# Patient Record
Sex: Female | Born: 1985 | Race: White | Hispanic: No | Marital: Married | Smoking: Former smoker
Health system: Southern US, Community
[De-identification: ages and names within clinical notes are randomized; demographics above are authoritative.]

## PROBLEM LIST (undated history)

## (undated) DIAGNOSIS — D649 Anemia, unspecified: Secondary | ICD-10-CM

## (undated) DIAGNOSIS — F419 Anxiety disorder, unspecified: Secondary | ICD-10-CM

## (undated) DIAGNOSIS — C801 Malignant (primary) neoplasm, unspecified: Secondary | ICD-10-CM

---

## 2014-12-29 DIAGNOSIS — B009 Herpesviral infection, unspecified: Secondary | ICD-10-CM | POA: Insufficient documentation

## 2014-12-29 DIAGNOSIS — Z720 Tobacco use: Secondary | ICD-10-CM | POA: Insufficient documentation

## 2015-01-25 DIAGNOSIS — D069 Carcinoma in situ of cervix, unspecified: Secondary | ICD-10-CM | POA: Insufficient documentation

## 2015-05-31 DIAGNOSIS — E663 Overweight: Secondary | ICD-10-CM | POA: Insufficient documentation

## 2015-10-06 DIAGNOSIS — L819 Disorder of pigmentation, unspecified: Secondary | ICD-10-CM | POA: Insufficient documentation

## 2015-10-25 DIAGNOSIS — N730 Acute parametritis and pelvic cellulitis: Secondary | ICD-10-CM | POA: Insufficient documentation

## 2015-11-10 DIAGNOSIS — D229 Melanocytic nevi, unspecified: Secondary | ICD-10-CM | POA: Insufficient documentation

## 2016-01-31 DIAGNOSIS — F17200 Nicotine dependence, unspecified, uncomplicated: Secondary | ICD-10-CM | POA: Insufficient documentation

## 2016-01-31 DIAGNOSIS — R194 Change in bowel habit: Secondary | ICD-10-CM | POA: Insufficient documentation

## 2016-04-10 DIAGNOSIS — J0141 Acute recurrent pansinusitis: Secondary | ICD-10-CM | POA: Diagnosis not present

## 2016-04-10 DIAGNOSIS — J3489 Other specified disorders of nose and nasal sinuses: Secondary | ICD-10-CM | POA: Diagnosis not present

## 2016-04-10 DIAGNOSIS — R0982 Postnasal drip: Secondary | ICD-10-CM | POA: Diagnosis not present

## 2016-04-10 DIAGNOSIS — R05 Cough: Secondary | ICD-10-CM | POA: Diagnosis not present

## 2016-07-12 DIAGNOSIS — D061 Carcinoma in situ of exocervix: Secondary | ICD-10-CM | POA: Diagnosis not present

## 2016-10-28 DIAGNOSIS — S91115A Laceration without foreign body of left lesser toe(s) without damage to nail, initial encounter: Secondary | ICD-10-CM | POA: Diagnosis not present

## 2016-11-14 DIAGNOSIS — F430 Acute stress reaction: Secondary | ICD-10-CM | POA: Diagnosis not present

## 2016-11-14 DIAGNOSIS — R41840 Attention and concentration deficit: Secondary | ICD-10-CM | POA: Diagnosis not present

## 2017-01-03 DIAGNOSIS — Z0000 Encounter for general adult medical examination without abnormal findings: Secondary | ICD-10-CM | POA: Diagnosis not present

## 2017-01-17 DIAGNOSIS — J019 Acute sinusitis, unspecified: Secondary | ICD-10-CM | POA: Diagnosis not present

## 2017-01-17 DIAGNOSIS — D061 Carcinoma in situ of exocervix: Secondary | ICD-10-CM | POA: Diagnosis not present

## 2017-01-17 DIAGNOSIS — F17200 Nicotine dependence, unspecified, uncomplicated: Secondary | ICD-10-CM | POA: Diagnosis not present

## 2017-01-17 DIAGNOSIS — B009 Herpesviral infection, unspecified: Secondary | ICD-10-CM | POA: Diagnosis not present

## 2017-01-17 DIAGNOSIS — Z0000 Encounter for general adult medical examination without abnormal findings: Secondary | ICD-10-CM | POA: Diagnosis not present

## 2017-01-17 DIAGNOSIS — F430 Acute stress reaction: Secondary | ICD-10-CM | POA: Diagnosis not present

## 2017-02-07 MED FILL — ESCITALOPRAM 10 MG TABLET: 10 | 30 days supply | Qty: 15 | Fill #0

## 2017-03-28 MED FILL — ESCITALOPRAM 10 MG TABLET: 10 | 30 days supply | Qty: 15 | Fill #0

## 2017-04-17 DIAGNOSIS — F430 Acute stress reaction: Secondary | ICD-10-CM | POA: Diagnosis not present

## 2017-04-17 DIAGNOSIS — Z720 Tobacco use: Secondary | ICD-10-CM | POA: Diagnosis not present

## 2017-04-17 MED FILL — SERTRALINE HCL 25 MG TABLET: 25 | 30 days supply | Qty: 15 | Fill #0

## 2017-05-12 MED FILL — SERTRALINE HCL 25 MG TABLET: 25 | 90 days supply | Qty: 90 | Fill #0

## 2017-07-07 DIAGNOSIS — R079 Chest pain, unspecified: Secondary | ICD-10-CM | POA: Diagnosis not present

## 2017-07-07 DIAGNOSIS — R072 Precordial pain: Secondary | ICD-10-CM | POA: Diagnosis not present

## 2017-07-08 DIAGNOSIS — R079 Chest pain, unspecified: Secondary | ICD-10-CM | POA: Diagnosis not present

## 2017-08-06 DIAGNOSIS — D72829 Elevated white blood cell count, unspecified: Secondary | ICD-10-CM | POA: Diagnosis not present

## 2017-08-06 DIAGNOSIS — E876 Hypokalemia: Secondary | ICD-10-CM | POA: Diagnosis not present

## 2017-08-06 DIAGNOSIS — F17200 Nicotine dependence, unspecified, uncomplicated: Secondary | ICD-10-CM | POA: Diagnosis not present

## 2017-08-06 DIAGNOSIS — R002 Palpitations: Secondary | ICD-10-CM | POA: Diagnosis not present

## 2017-08-06 DIAGNOSIS — R072 Precordial pain: Secondary | ICD-10-CM | POA: Diagnosis not present

## 2017-08-06 DIAGNOSIS — Z09 Encounter for follow-up examination after completed treatment for conditions other than malignant neoplasm: Secondary | ICD-10-CM | POA: Diagnosis not present

## 2017-08-06 DIAGNOSIS — R454 Irritability and anger: Secondary | ICD-10-CM | POA: Diagnosis not present

## 2017-08-06 DIAGNOSIS — J309 Allergic rhinitis, unspecified: Secondary | ICD-10-CM | POA: Diagnosis not present

## 2017-08-06 DIAGNOSIS — R4586 Emotional lability: Secondary | ICD-10-CM | POA: Diagnosis not present

## 2017-08-29 DIAGNOSIS — H52203 Unspecified astigmatism, bilateral: Secondary | ICD-10-CM | POA: Diagnosis not present

## 2017-09-17 DIAGNOSIS — R102 Pelvic and perineal pain: Secondary | ICD-10-CM | POA: Diagnosis not present

## 2017-09-17 DIAGNOSIS — N730 Acute parametritis and pelvic cellulitis: Secondary | ICD-10-CM | POA: Diagnosis not present

## 2017-09-17 DIAGNOSIS — Z975 Presence of (intrauterine) contraceptive device: Secondary | ICD-10-CM | POA: Diagnosis not present

## 2017-10-03 DIAGNOSIS — R4586 Emotional lability: Secondary | ICD-10-CM | POA: Diagnosis not present

## 2017-10-03 DIAGNOSIS — R454 Irritability and anger: Secondary | ICD-10-CM | POA: Diagnosis not present

## 2017-10-03 DIAGNOSIS — F430 Acute stress reaction: Secondary | ICD-10-CM | POA: Diagnosis not present

## 2017-10-15 MED FILL — busPIRone HCL 10 MG TABS: 10 | 30 days supply | Qty: 45 | Fill #0

## 2017-11-18 MED FILL — busPIRone HCL 10 MG TABS: 10 | 30 days supply | Qty: 45 | Fill #1

## 2017-12-11 ENCOUNTER — Telehealth: Payer: Self-pay | Admitting: Allergy & Immunology

## 2017-12-11 DIAGNOSIS — Z578 Occupational exposure to other risk factors: Secondary | ICD-10-CM | POA: Diagnosis not present

## 2017-12-11 NOTE — Telephone Encounter (Signed)
Chandy was exposed to a patient's blood. Labs placed per protocol.   Salvatore Marvel, MD [facility]

## 2017-12-13 LAB — HCV RNA QUANT: Hepatitis C Quantitation: NOT DETECTED IU/mL

## 2017-12-13 LAB — HEPATITIS C ANTIBODY: Hep C Virus Ab: <0.1 s/co ratio (ref 0.0–0.9)

## 2017-12-13 LAB — HEPATITIS B SURFACE ANTIGEN: HEP B S AG: NEGATIVE

## 2017-12-13 LAB — HEPATITIS B SURFACE ANTIBODY,QUALITATIVE: Hep B Surface Ab, Qual: REACTIVE

## 2017-12-13 LAB — HIV ANTIBODY (ROUTINE TESTING W REFLEX): HIV SCREEN 4TH GENERATION: NONREACTIVE

## 2017-12-13 LAB — HIV-1 RNA QUANT-NO REFLEX-BLD

## 2017-12-13 LAB — HEPATITIS B CORE ANTIBODY, TOTAL: Hep B Core Total Ab: NEGATIVE

## 2017-12-22 MED FILL — busPIRone HCL 10 MG TABS: 10 | 30 days supply | Qty: 45 | Fill #2

## 2018-01-30 DIAGNOSIS — E669 Obesity, unspecified: Secondary | ICD-10-CM | POA: Diagnosis not present

## 2018-01-30 DIAGNOSIS — Z0000 Encounter for general adult medical examination without abnormal findings: Secondary | ICD-10-CM | POA: Diagnosis not present

## 2018-01-30 DIAGNOSIS — Z1151 Encounter for screening for human papillomavirus (HPV): Secondary | ICD-10-CM | POA: Diagnosis not present

## 2018-01-30 DIAGNOSIS — D061 Carcinoma in situ of exocervix: Secondary | ICD-10-CM | POA: Diagnosis not present

## 2018-01-30 DIAGNOSIS — Z01419 Encounter for gynecological examination (general) (routine) without abnormal findings: Secondary | ICD-10-CM | POA: Diagnosis not present

## 2018-01-30 DIAGNOSIS — Z720 Tobacco use: Secondary | ICD-10-CM | POA: Diagnosis not present

## 2018-01-30 DIAGNOSIS — Z124 Encounter for screening for malignant neoplasm of cervix: Secondary | ICD-10-CM | POA: Diagnosis not present

## 2018-01-30 DIAGNOSIS — F430 Acute stress reaction: Secondary | ICD-10-CM | POA: Diagnosis not present

## 2018-01-30 DIAGNOSIS — B009 Herpesviral infection, unspecified: Secondary | ICD-10-CM | POA: Diagnosis not present

## 2018-01-30 DIAGNOSIS — F17210 Nicotine dependence, cigarettes, uncomplicated: Secondary | ICD-10-CM | POA: Diagnosis not present

## 2018-01-30 DIAGNOSIS — Z23 Encounter for immunization: Secondary | ICD-10-CM | POA: Diagnosis not present

## 2018-02-16 DIAGNOSIS — W19XXXA Unspecified fall, initial encounter: Secondary | ICD-10-CM | POA: Diagnosis not present

## 2018-02-16 DIAGNOSIS — M542 Cervicalgia: Secondary | ICD-10-CM | POA: Diagnosis not present

## 2018-02-16 DIAGNOSIS — M62838 Other muscle spasm: Secondary | ICD-10-CM | POA: Diagnosis not present

## 2018-02-16 DIAGNOSIS — S161XXA Strain of muscle, fascia and tendon at neck level, initial encounter: Secondary | ICD-10-CM | POA: Diagnosis not present

## 2018-02-16 DIAGNOSIS — Y999 Unspecified external cause status: Secondary | ICD-10-CM | POA: Diagnosis not present

## 2018-03-04 ENCOUNTER — Encounter (HOSPITAL_COMMUNITY): Payer: Self-pay | Admitting: Emergency Medicine

## 2018-03-04 ENCOUNTER — Ambulatory Visit (HOSPITAL_COMMUNITY)
Admission: EM | Admit: 2018-03-04 | Discharge: 2018-03-04 | Disposition: A | Discharge status: Home / Self Care | Payer: 59 | Attending: Family Medicine | Admitting: Family Medicine

## 2018-03-04 DIAGNOSIS — H538 Other visual disturbances: Secondary | ICD-10-CM

## 2018-03-04 DIAGNOSIS — R61 Generalized hyperhidrosis: Secondary | ICD-10-CM | POA: Diagnosis not present

## 2018-03-04 DIAGNOSIS — Z3202 Encounter for pregnancy test, result negative: Secondary | ICD-10-CM | POA: Diagnosis not present

## 2018-03-04 DIAGNOSIS — Z8659 Personal history of other mental and behavioral disorders: Secondary | ICD-10-CM

## 2018-03-04 DIAGNOSIS — R110 Nausea: Secondary | ICD-10-CM

## 2018-03-04 DIAGNOSIS — R42 Dizziness and giddiness: Secondary | ICD-10-CM | POA: Diagnosis not present

## 2018-03-04 DIAGNOSIS — R55 Syncope and collapse: Secondary | ICD-10-CM

## 2018-03-04 LAB — POCT PREGNANCY, URINE: Preg Test, Ur: NEGATIVE

## 2018-03-04 NOTE — Discharge Instructions (Addendum)
Recommend continue to monitor symptoms. Try to stay cool since overheating could have caused this episode. Continue to push fluids today such as Gatorade or Powerade. Follow-up with your PCP if dizziness or lightheadedness return within the next few days or go to the ER if symptoms worsen.

## 2018-03-04 NOTE — ED Triage Notes (Signed)
Pt states she had a near syncopal episode at work, pt states she felt lightheaded and dizzy, sweaty palms, knees weak, blurry vision. Denies LOC.

## 2018-03-05 NOTE — ED Provider Notes (Signed)
[name]    CSN: [id number] Arrival date & time: 03/04/18  2355     History   Chief Complaint Chief Complaint  Patient presents with  . Near Syncope    HPI Brittney Black is a 32 y.o. female.   32 year old female presents with near syncopal episode about 1 hour ago while at work. 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 She then got lightheaded, sweaty, dizzy and experienced some blurred vision. She helped herself down to the floor and sat for 30 minutes. She denies any LOC, headache, URI symptoms, chest pain, palpitations, shortness of breath, nausea or vomiting. She has had a previous episode before when she got overheated. Today the heat in her workplace was "turned up" and she felt too warm. She just quit smoking 2 weeks ago but still occassionally vapes. She denies any recent alcohol and no illicit drug use. No recent change in diet. She currently has a Mirena IUD in place but requests pregnancy test "just to make sure". She had a full GYN and comprehensive annual exam about 1 month ago- able to view encounter but not able to view labs in [place]. Patient pulled up her labwork on her phone. CBC, Chem profile, TSH were all essentially normal. Had random glucose tested at her workplace today during her episode and was 91. Does have a history of anemia over 5 years ago but no longer on any medication. Other chronic health issues include anxiety and allergies and takes Buspar daily and Allegra, Flonase, Nexium, Valtrex and Ibuprofen prn. Patient states she "feels fine now" but employer will not let her return to work without evaluation.   The history is provided by the patient.    History reviewed. No pertinent past medical history.  Patient Active Problem List   Diagnosis Date Noted  . Frequent bowel movements 01/31/2016  . Tobacco dependency 01/31/2016  . Compound nevus of skin 11/10/2015  . Acute PID (pelvic inflammatory  disease) 10/25/2015  . Changing pigmented skin lesion 10/06/2015  . Overweight (BMI 25.0-29.9) 05/31/2015  . 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 Herpesviral infection, unspecified 12/29/2014  . Tobacco use 12/29/2014    History reviewed. No pertinent surgical history.  OB History   None      Home Medications    Prior to Admission medications   Medication Sig Start Date End Date Taking? Authorizing Provider  busPIRone (BUSPAR) 15 MG tablet Take by mouth. 01/30/18  Yes [provider]  fexofenadine (ALLEGRA) 180 MG tablet  07/03/16  Yes [provider]  Fluticasone Propionate (XHANCE) 93 MCG/ACT [place] into the nose. 08/06/17  Yes [provider]  levonorgestrel (MIRENA) 20 MCG/24HR IUD by Intrauterine route. 03/03/15  Yes [provider]  valACYclovir (VALTREX) 1000 MG tablet Take by mouth. 01/30/18  Yes [provider]  esomeprazole (NEXIUM) 20 MG capsule Take by mouth.    [provider]  ibuprofen (ADVIL,MOTRIN) 600 MG tablet Take by mouth.    [provider]    Family History Family History  Problem Relation Age of Onset  . Healthy Mother   . Healthy Father     Social History Social History   Tobacco Use  . Smoking status: Former Smoker  Substance Use Topics  . Alcohol use: Yes    Frequency: Never  . Drug use: Never     Allergies   Latex; Lorazepam; Naproxen; Naproxen sodium; Acetaminophen; Niacin;  and Niacin and related   Review of Systems Review of Systems  Constitutional: Negative for activity change, appetite change, chills, diaphoresis, fatigue and fever.  HENT: Negative for congestion, ear pain, facial swelling, hearing loss, mouth sores, nosebleeds, postnasal drip, rhinorrhea, sinus pressure, sinus pain, sneezing, sore throat, tinnitus and trouble swallowing.   Eyes: Positive for visual disturbance (during episode- normal now). Negative for photophobia, pain, discharge,  redness and itching.  Respiratory: Negative for cough, chest tightness, shortness of breath, wheezing and stridor.   Cardiovascular: Negative for chest pain, palpitations and leg swelling.  Gastrointestinal: Positive for nausea. Negative for abdominal pain, constipation, diarrhea and vomiting.  Genitourinary: Negative for decreased urine volume, difficulty urinating, frequency, hematuria, urgency, vaginal bleeding and vaginal discharge.  Musculoskeletal: Negative for arthralgias, back pain, myalgias, neck pain and neck stiffness.  Skin: Negative for color change, pallor, rash and wound.  Allergic/Immunologic: Positive for environmental allergies. Negative for immunocompromised state.  Neurological: Positive for dizziness and light-headedness. Negative for tremors, seizures, syncope, facial asymmetry, speech difficulty, weakness, numbness and headaches.  Hematological: Negative for adenopathy. Does not bruise/bleed easily.     Physical Exam Triage Vital Signs ED Triage Vitals  Enc Vitals Group     BP 03/04/18 1049 (!) 141/67     Pulse Rate 03/04/18 1049 73     Resp 03/04/18 1049 16     Temp 03/04/18 1049 98.2 F (36.8 C)     Temp src --      SpO2 03/04/18 1049 100 %     Weight --      Height --      Head Circumference --      Peak Flow --      Pain Score 03/04/18 1050 0     Pain Loc --      Pain Edu? --      Excl. in [place]? --    Orthostatic VS for the past 24 hrs:  BP- Lying Pulse- Lying BP- Sitting Pulse- Sitting BP- Standing at 0 minutes  03/04/18 1108 125/80 79 124/90 82 -    Updated Vital Signs BP (!) 141/67   Pulse 73   Temp 98.2 F (36.8 C)   Resp 16   SpO2 100%   Visual Acuity Right Eye Distance:   Left Eye Distance:   Bilateral Distance:    Right Eye Near:   Left Eye Near:    Bilateral Near:     Physical Exam  Constitutional: She is oriented to person, place, and time. She appears well-developed and well-nourished. She is cooperative. She does not appear  ill. No distress.  Patient sitting comfortably on exam table in no acute distress. Orthostatic vitals were obtained and no distinct changes noted other than difficulty with automatic BP measurement when patient was standing. Obtained manual BP which was normal.   HENT:  Head: Normocephalic and atraumatic.  Right Ear: Hearing, tympanic membrane, external ear and ear canal normal.  Left Ear: Hearing, tympanic membrane, external ear and ear canal normal.  Nose: Nose normal.  Mouth/Throat: Uvula is midline, oropharynx is clear and moist and mucous membranes are normal.  Eyes: Pupils are equal, round, and reactive to light. Conjunctivae and EOM are normal.  Neck: Normal range of motion. Neck supple.  Cardiovascular: Normal rate, regular rhythm, normal heart sounds, intact distal pulses and normal pulses.  No murmur heard. Pulmonary/Chest: Effort normal and breath sounds normal. No respiratory distress. She has no decreased breath sounds. She has no wheezes. She has no rhonchi.  She has no rales.  Musculoskeletal: Normal range of motion.  Lymphadenopathy:    She has no cervical adenopathy.  Neurological: She is alert and oriented to person, place, and time. She has normal strength and normal reflexes. No cranial nerve deficit or sensory deficit. She displays a negative Romberg sign. Gait normal.  Skin: Skin is warm and dry. Capillary refill takes less than 2 seconds. No pallor.  Psychiatric: She has a normal mood and affect. Her behavior is normal. Judgment and thought content normal.  Nursing note and vitals reviewed.    UC Treatments / Results  Labs (all labs ordered are listed, but only abnormal results are displayed) Labs Reviewed  POCT PREGNANCY, URINE    EKG None  Radiology No results found.  Procedures Procedures (including critical care time)  Medications Ordered in UC Medications - No data to display  Initial Impression / Assessment and Plan / UC Course  I have reviewed  the triage vital signs and the nursing notes.  Pertinent labs & imaging results that were available during my care of the patient were reviewed by me and considered in my medical decision making (see chart for details).    Reviewed negative urine pregnancy test with patient. Discussed that since she is feeling much better now and random glucose measurement taken at work today was normal (91) as well as recent Chemistry and CBC labs done in the past month were normal and no abnormalities found on exam, no additional work-up needed at this time. Continue to monitor symptoms. Recommend try to stay cool and do not get overheated. Recommend push water and other fluids such as Gatorade today. Note written to go back to work now with no restrictions. Recommend follow-up with her PCP if dizziness, lightheadedness or near syncope occurs within the next few days or go to the ER if symptoms worsen or any chest pain, difficulty breathing or headache develop.  Final Clinical Impressions(s) / UC Diagnoses   Final diagnoses:  Near syncope  Nausea without vomiting  Hx of anxiety disorder     Discharge Instructions     Recommend continue to monitor symptoms. Try to stay cool since overheating could have caused this episode. Continue to push fluids today such as Gatorade or Powerade. Follow-up with your PCP if dizziness or lightheadedness return within the next few days or go to the ER if symptoms worsen.     ED Prescriptions    None     Controlled Substance Prescriptions  Controlled Substance Registry consulted? Not Applicable   Katy Apo, NP 03/05/18 1019

## 2018-03-18 MED FILL — busPIRone HCL 15 MG TABS: 15 | 90 days supply | Qty: 180 | Fill #0

## 2018-04-02 DIAGNOSIS — J069 Acute upper respiratory infection, unspecified: Secondary | ICD-10-CM | POA: Diagnosis not present

## 2018-05-01 DIAGNOSIS — E669 Obesity, unspecified: Secondary | ICD-10-CM | POA: Diagnosis not present

## 2018-05-01 DIAGNOSIS — Z6830 Body mass index (BMI) 30.0-30.9, adult: Secondary | ICD-10-CM | POA: Diagnosis not present

## 2018-05-01 DIAGNOSIS — L700 Acne vulgaris: Secondary | ICD-10-CM | POA: Diagnosis not present

## 2018-05-01 DIAGNOSIS — K5900 Constipation, unspecified: Secondary | ICD-10-CM | POA: Diagnosis not present

## 2018-05-01 MED FILL — CLINDAMYCIN PH 1% GEL: 1 | 30 days supply | Qty: 60 | Fill #0

## 2018-06-18 DIAGNOSIS — K210 Gastro-esophageal reflux disease with esophagitis: Secondary | ICD-10-CM | POA: Diagnosis not present

## 2018-06-18 DIAGNOSIS — N926 Irregular menstruation, unspecified: Secondary | ICD-10-CM | POA: Diagnosis not present

## 2018-06-24 DIAGNOSIS — R1013 Epigastric pain: Secondary | ICD-10-CM | POA: Diagnosis not present

## 2018-06-24 DIAGNOSIS — R12 Heartburn: Secondary | ICD-10-CM | POA: Diagnosis not present

## 2018-06-24 DIAGNOSIS — R198 Other specified symptoms and signs involving the digestive system and abdomen: Secondary | ICD-10-CM | POA: Diagnosis not present

## 2018-06-29 DIAGNOSIS — R12 Heartburn: Secondary | ICD-10-CM | POA: Diagnosis not present

## 2018-06-29 DIAGNOSIS — K449 Diaphragmatic hernia without obstruction or gangrene: Secondary | ICD-10-CM | POA: Diagnosis not present

## 2018-06-29 DIAGNOSIS — K2950 Unspecified chronic gastritis without bleeding: Secondary | ICD-10-CM | POA: Diagnosis not present

## 2018-06-29 DIAGNOSIS — K2900 Acute gastritis without bleeding: Secondary | ICD-10-CM | POA: Diagnosis not present

## 2018-06-29 DIAGNOSIS — R1013 Epigastric pain: Secondary | ICD-10-CM | POA: Diagnosis not present

## 2018-07-02 DIAGNOSIS — K8020 Calculus of gallbladder without cholecystitis without obstruction: Secondary | ICD-10-CM | POA: Diagnosis not present

## 2018-07-02 DIAGNOSIS — R1013 Epigastric pain: Secondary | ICD-10-CM | POA: Diagnosis not present

## 2018-07-03 ENCOUNTER — Ambulatory Visit: Payer: Self-pay | Admitting: Surgery

## 2018-07-03 DIAGNOSIS — K8020 Calculus of gallbladder without cholecystitis without obstruction: Secondary | ICD-10-CM | POA: Diagnosis not present

## 2018-07-03 NOTE — H&P (Signed)
Brittney Black Documented: 07/03/2018 9:50 AM Location: [facility] Patient #: [id number] DOB: [birth year] Married / Language: English / Race: White Female  History of Present Illness Marcello Moores A. Joeanna Howdyshell MD; 07/03/2018 10:13 AM) Patient words: Patient sent at the request of Dr. Shana Chute for 2 week history of severe epigastric abdominal pain. She also has significant heartburn. He symptoms wax and wane. Symptoms are made worse with eating with no specific food contributing to the pain per se. She also has bouts of pain the last minutes to hours location epigastric right upper quadrant with some radiation to her back from time to time. She is here careful about what she eats and drinks now. She had an endoscopy 2 days ago which showed gastritis. Recent ultrasound showed gallstones without gallbladder wall thickening or common bile duct dilation. Patient denies fever, chills, cough, or any recent other infection.  The patient is a 33 year old female.   Past Surgical History Geni Bers [place], RMA; 07/03/2018 9:51 AM) No pertinent past surgical history  Diagnostic Studies History Geni Bers [place], RMA; 07/03/2018 9:51 AM) Colonoscopy never Mammogram never Pap Smear 1-5 years ago  Allergies Geni Bers [place], RMA; 07/03/2018 9:53 AM) Niacin (Antihyperlipidemic) *ANTIHYPERLIPIDEMICS* Hives. Ativan *ANTIANXIETY AGENTS* Latex Exam Gloves *MEDICAL DEVICES AND SUPPLIES* Hives, Swollen lips. Aleve *ANALGESICS - ANTI-INFLAMMATORY* Headache, Hives. Allergies Reconciled  Medication History [employer], RMA; 07/03/2018 9:55 AM) IUD's (Intrauterine) Active. Allegra Allergy (Oral) Specific strength unknown - Active. Valtrex (500MG  Tablet, Oral) Active. Xhance (93MCG/ACT Exhaler Susp, Nasal) Active. busPIRone HCl (15MG  Tablet, Oral) Active. Dexilant (60MG  Capsule DR, Oral) Active. Famotidine (40MG  Tablet, Oral) Active. Clindamycin Phosphate (1% Gel, External)  Active. Medications Reconciled  Social History Geni Bers [place], RMA; 07/03/2018 9:51 AM) Alcohol use Occasional alcohol use. Caffeine use Carbonated beverages, Coffee. No drug use Tobacco use Former smoker.  Family History Geni Bers [place], RMA; 07/03/2018 9:51 AM) Arthritis Family Members In General. Cancer Family Members In General. Cerebrovascular Accident Family Members In General. Cervical Cancer Family Members In General, Mother. Depression Sister. Heart Disease Family Members In General. Hypertension Father. Respiratory Condition Family Members In General.  Pregnancy / Birth History Geni Bers [place], [place]; 07/03/2018 9:51 AM) Age at menarche 67 years. Contraceptive History Intrauterine device. Gravida 1 Irregular periods Length (months) of breastfeeding 3-6 Maternal age 33-20 Para 1  Other Problems Geni Bers [place], RMA; 07/03/2018 9:51 AM) Anxiety Disorder Cholelithiasis     Review of Systems Geni Bers [place] RMA; 07/03/2018 9:51 AM) General Present- Appetite Loss. Not Present- Chills, Fatigue, Fever, Night Sweats, Weight Gain and Weight Loss. Skin Not Present- Change in Wart/Mole, Dryness, Hives, Jaundice, New Lesions, Non-Healing Wounds, Rash and Ulcer. HEENT Not Present- Earache, Hearing Loss, Hoarseness, Nose Bleed, Oral Ulcers, Ringing in the Ears, Seasonal Allergies, Sinus Pain, Sore Throat, Visual Disturbances, Wears glasses/contact lenses and Yellow Eyes. Respiratory Not Present- Bloody sputum, Chronic Cough, Difficulty Breathing, Snoring and Wheezing. Breast Not Present- Breast Mass, Breast Pain, Nipple Discharge and Skin Changes. Cardiovascular Not Present- Chest Pain, Difficulty Breathing Lying Down, Leg Cramps, Palpitations, Rapid Heart Rate, Shortness of Breath and Swelling of Extremities. Gastrointestinal Present- Abdominal Pain, Bloating, Change in Bowel Habits, Excessive gas, Gets full quickly at meals, Indigestion and  Nausea. Not Present- Bloody Stool, Chronic diarrhea, Constipation, Difficulty Swallowing, Hemorrhoids, Rectal Pain and Vomiting. Female Genitourinary Not Present- Frequency, Nocturia, Painful Urination, Pelvic Pain and Urgency. Musculoskeletal Present- Back Pain. Not Present- Joint Pain, Joint Stiffness, Muscle Pain, Muscle Weakness and Swelling of Extremities. Neurological Not Present- Decreased Memory, Fainting, Headaches, Numbness, Seizures, Tingling, Tremor,  Trouble walking and Weakness. Psychiatric Not Present- Anxiety, Bipolar, Change in Sleep Pattern, Depression, Fearful and Frequent crying. Endocrine Not Present- Cold Intolerance, Excessive Hunger, Hair Changes, Heat Intolerance, Hot flashes and New Diabetes. Hematology Not Present- Blood Thinners, Easy Bruising, Excessive bleeding, Gland problems, HIV and Persistent Infections.  Vitals Geni Bers [place] RMA; 07/03/2018 9:55 AM) 07/03/2018 9:55 AM Weight: 198 lb Height: 67in Body Surface Area: 2.01 m Body Mass Index: 31.01 kg/m  Temp.: 98.106F(Oral)  Pulse: 102 (Regular)  P.OX: 99% (Room air) BP: 122/78 (Sitting, Left Arm, Standard)      Physical Exam (Briselda Naval A. Milisa Kimbell MD; 07/03/2018 10:13 AM)  General Mental Status-Alert. General Appearance-Consistent with stated age. Hydration-Well hydrated. Voice-Normal.  Head and Neck Head-normocephalic, atraumatic with no lesions or palpable masses.  Chest and Lung Exam Chest and lung exam reveals -quiet, even and easy respiratory effort with no use of accessory muscles and on auscultation, normal breath sounds, no adventitious sounds and normal vocal resonance. Inspection Chest Wall - Normal. Back - normal.  Cardiovascular Cardiovascular examination reveals -on palpation PMI is normal in location and amplitude, no palpable S3 or S4. Normal cardiac borders., normal heart sounds, regular rate and rhythm with no murmurs, carotid auscultation reveals no bruits  and normal pedal pulses bilaterally.  Abdomen Inspection Inspection of the abdomen reveals - No Hernias. Skin - Scar - no surgical scars. Palpation/Percussion Palpation and Percussion of the abdomen reveal - Soft, Non Tender, No Rebound tenderness, No Rigidity (guarding) and No hepatosplenomegaly. Auscultation Auscultation of the abdomen reveals - Bowel sounds normal.  Neurologic Neurologic evaluation reveals -alert and oriented x 3 with no impairment of recent or remote memory. Mental Status-Normal.  Musculoskeletal Normal Exam - Left-Upper Extremity Strength Normal and Lower Extremity Strength Normal. Normal Exam - Right-Upper Extremity Strength Normal, Lower Extremity Weakness.    Assessment & Plan (Filiberto Wamble A. Giles Currie MD; 07/03/2018 10:15 AM)  SYMPTOMATIC CHOLELITHIASIS (K80.20) Impression: Patient has significant severe biliary colic. She is having symptoms every day therefore recommend laparoscopic cholecystectomy sooner as opposed to later. Her pain is daily and is impacting her quality of life and ability drink/eat and I think waiting a prolonged period time for this would be ill advised. I discussed covid and the implications to surgery. She would like to proceed as soon as possible since she is quite symptomatic and doesn't think she can wait another month out the stone. I recommended a low-fat diet for now and to go the emergency room if her symptoms become worse and they are now. She is able to control her symptoms but she hasn't daily and I think will worsen sooner as opposed to later. The procedure has been discussed with the patient. Risks of laparoscopic cholecystectomy include bleeding, infection, bile duct injury, leak, death, open surgery, diarrhea, other surgery, organ injury, blood vessel injury, DVT, and additional care.  Current Plans You are being scheduled for surgery- Our schedulers will call you.  You should hear from our office's scheduling department  within 5 working days about the location, date, and time of surgery. We try to make accommodations for patient's preferences in scheduling surgery, but sometimes the OR schedule or the surgeon's schedule prevents [place] from making those accommodations.  If you have not heard from our office [phone]) in 5 working days, call the office and ask for your surgeon's nurse.  If you have other questions about your diagnosis, plan, or surgery, call the office and ask for your surgeon's nurse.  Pt Education - Pamphlet Given -  Laparoscopic Gallbladder Surgery: discussed with patient and provided information. The anatomy & physiology of hepatobiliary & pancreatic function was discussed. The pathophysiology of gallbladder dysfunction was discussed. Natural history risks without surgery was discussed. I feel the risks of no intervention will lead to serious problems that outweigh the operative risks; therefore, I recommended cholecystectomy to remove the pathology. I explained laparoscopic techniques with possible need for an open approach. Probable cholangiogram to evaluate the bilary tract was explained as well.  Risks such as bleeding, infection, abscess, leak, injury to other organs, need for further treatment, heart attack, death, and other risks were discussed. I noted a good likelihood this will help address the problem. Possibility that this will not correct all abdominal symptoms was explained. Goals of post-operative recovery were discussed as well. We will work to minimize complications. An educational handout further explaining the pathology and treatment options was given as well. Questions were answered. The patient expresses understanding & wishes to proceed with surgery.  Pt Education - CCS Laparosopic Post Op HCI ([place]ross) Pt Education - Laparoscopic Cholecystectomy: gallbladder

## 2018-07-03 NOTE — H&P (View-Only) (Signed)
Brittney Black Documented: 07/03/2018 9:50 AM Location: [facility] Patient #: [id number] DOB: 1985-11-03 Married / Language: English / Race: White Female  History of Present Illness Brittney Black A. Lonzie Simmer MD; 07/03/2018 10:13 AM) Patient words: Patient sent at the request of Dr. Shana Chute for 2 week history of severe epigastric abdominal pain. She also has significant heartburn. He symptoms wax and wane. Symptoms are made worse with eating with no specific food contributing to the pain per se. She also has bouts of pain the last minutes to hours location epigastric right upper quadrant with some radiation to her back from time to time. She is here careful about what she eats and drinks now. She had an endoscopy 2 days ago which showed gastritis. Recent ultrasound showed gallstones without gallbladder wall thickening or common bile duct dilation. Patient denies fever, chills, cough, or any recent other infection.  The patient is a 33 year old female.   Past Surgical History Geni Bers [place], RMA; 07/03/2018 9:51 AM) No pertinent past surgical history  Diagnostic Studies History Geni Bers [place], RMA; 07/03/2018 9:51 AM) Colonoscopy never Mammogram never Pap Smear 1-5 years ago  Allergies Geni Bers [place], RMA; 07/03/2018 9:53 AM) Niacin (Antihyperlipidemic) *ANTIHYPERLIPIDEMICS* Hives. Ativan *ANTIANXIETY AGENTS* Latex Exam Gloves *MEDICAL DEVICES AND SUPPLIES* Hives, Swollen lips. Aleve *ANALGESICS - ANTI-INFLAMMATORY* Headache, Hives. Allergies Reconciled  Medication History [employer], RMA; 07/03/2018 9:55 AM) IUD's (Intrauterine) Active. Allegra Allergy (Oral) Specific strength unknown - Active. Valtrex (500MG  Tablet, Oral) Active. Xhance (93MCG/ACT Exhaler Susp, Nasal) Active. busPIRone HCl (15MG  Tablet, Oral) Active. Dexilant (60MG  Capsule DR, Oral) Active. Famotidine (40MG  Tablet, Oral) Active. Clindamycin Phosphate (1% Gel, External)  Active. Medications Reconciled  Social History Geni Bers [place], RMA; 07/03/2018 9:51 AM) Alcohol use Occasional alcohol use. Caffeine use Carbonated beverages, Coffee. No drug use Tobacco use Former smoker.  Family History Geni Bers [place], RMA; 07/03/2018 9:51 AM) Arthritis Family Members In General. Cancer Family Members In General. Cerebrovascular Accident Family Members In General. Cervical Cancer Family Members In General, Mother. Depression Sister. Heart Disease Family Members In General. Hypertension Father. Respiratory Condition Family Members In General.  Pregnancy / Birth History Geni Bers [place], [place]; 07/03/2018 9:51 AM) Age at menarche 58 years. Contraceptive History Intrauterine device. Gravida 1 Irregular periods Length (months) of breastfeeding 3-6 Maternal age 64-20 Para 1  Other Problems Geni Bers [place], RMA; 07/03/2018 9:51 AM) Anxiety Disorder Cholelithiasis     Review of Systems Geni Bers [place] RMA; 07/03/2018 9:51 AM) General Present- Appetite Loss. Not Present- Chills, Fatigue, Fever, Night Sweats, Weight Gain and Weight Loss. Skin Not Present- Change in Wart/Mole, Dryness, Hives, Jaundice, New Lesions, Non-Healing Wounds, Rash and Ulcer. HEENT Not Present- Earache, Hearing Loss, Hoarseness, Nose Bleed, Oral Ulcers, Ringing in the Ears, Seasonal Allergies, Sinus Pain, Sore Throat, Visual Disturbances, Wears glasses/contact lenses and Yellow Eyes. Respiratory Not Present- Bloody sputum, Chronic Cough, Difficulty Breathing, Snoring and Wheezing. Breast Not Present- Breast Mass, Breast Pain, Nipple Discharge and Skin Changes. Cardiovascular Not Present- Chest Pain, Difficulty Breathing Lying Down, Leg Cramps, Palpitations, Rapid Heart Rate, Shortness of Breath and Swelling of Extremities. Gastrointestinal Present- Abdominal Pain, Bloating, Change in Bowel Habits, Excessive gas, Gets full quickly at meals, Indigestion and  Nausea. Not Present- Bloody Stool, Chronic diarrhea, Constipation, Difficulty Swallowing, Hemorrhoids, Rectal Pain and Vomiting. Female Genitourinary Not Present- Frequency, Nocturia, Painful Urination, Pelvic Pain and Urgency. Musculoskeletal Present- Back Pain. Not Present- Joint Pain, Joint Stiffness, Muscle Pain, Muscle Weakness and Swelling of Extremities. Neurological Not Present- Decreased Memory, Fainting, Headaches, Numbness, Seizures, Tingling, Tremor,  Trouble walking and Weakness. Psychiatric Not Present- Anxiety, Bipolar, Change in Sleep Pattern, Depression, Fearful and Frequent crying. Endocrine Not Present- Cold Intolerance, Excessive Hunger, Hair Changes, Heat Intolerance, Hot flashes and New Diabetes. Hematology Not Present- Blood Thinners, Easy Bruising, Excessive bleeding, Gland problems, HIV and Persistent Infections.  Vitals Geni Bers [place] RMA; 07/03/2018 9:55 AM) 07/03/2018 9:55 AM Weight: 198 lb Height: 67in Body Surface Area: 2.01 m Body Mass Index: 31.01 kg/m  Temp.: 98.60F(Oral)  Pulse: 102 (Regular)  P.OX: 99% (Room air) BP: 122/78 (Sitting, Left Arm, Standard)      Physical Exam ([place] Ackert A. Hatsuko Bizzarro MD; 07/03/2018 10:13 AM)  General Mental Status-Alert. General Appearance-Consistent with stated age. Hydration-Well hydrated. Voice-Normal.  Head and Neck Head-normocephalic, atraumatic with no lesions or palpable masses.  Chest and Lung Exam Chest and lung exam reveals -quiet, even and easy respiratory effort with no use of accessory muscles and on auscultation, normal breath sounds, no adventitious sounds and normal vocal resonance. Inspection Chest Wall - Normal. Back - normal.  Cardiovascular Cardiovascular examination reveals -on palpation PMI is normal in location and amplitude, no palpable S3 or S4. Normal cardiac borders., normal heart sounds, regular rate and rhythm with no murmurs, carotid auscultation reveals no bruits  and normal pedal pulses bilaterally.  Abdomen Inspection Inspection of the abdomen reveals - No Hernias. Skin - Scar - no surgical scars. Palpation/Percussion Palpation and Percussion of the abdomen reveal - Soft, Non Tender, No Rebound tenderness, No Rigidity (guarding) and No hepatosplenomegaly. Auscultation Auscultation of the abdomen reveals - Bowel sounds normal.  Neurologic Neurologic evaluation reveals -alert and oriented x 3 with no impairment of recent or remote memory. Mental Status-Normal.  Musculoskeletal Normal Exam - Left-Upper Extremity Strength Normal and Lower Extremity Strength Normal. Normal Exam - Right-Upper Extremity Strength Normal, Lower Extremity Weakness.    Assessment & Plan ([place] A. Jenilee Franey MD; 07/03/2018 10:15 AM)  SYMPTOMATIC CHOLELITHIASIS (K80.20) Impression: Patient has significant severe biliary colic. She is having symptoms every day therefore recommend laparoscopic cholecystectomy sooner as opposed to later. Her pain is daily and is impacting her quality of life and ability drink/eat and I think waiting a prolonged period time for this would be ill advised. I discussed covid and the implications to surgery. She would like to proceed as soon as possible since she is quite symptomatic and doesn't think she can wait another month out the stone. I recommended a low-fat diet for now and to go the emergency room if her symptoms become worse and they are now. She is able to control her symptoms but she hasn't daily and I think will worsen sooner as opposed to later. The procedure has been discussed with the patient. Risks of laparoscopic cholecystectomy include bleeding, infection, bile duct injury, leak, death, open surgery, diarrhea, other surgery, organ injury, blood vessel injury, DVT, and additional care.  Current Plans You are being scheduled for surgery- Our schedulers will call you.  You should hear from our office's scheduling department  within 5 working days about the location, date, and time of surgery. We try to make accommodations for patient's preferences in scheduling surgery, but sometimes the OR schedule or the surgeon's schedule prevents [place] from making those accommodations.  If you have not heard from our office [phone]) in 5 working days, call the office and ask for your surgeon's nurse.  If you have other questions about your diagnosis, plan, or surgery, call the office and ask for your surgeon's nurse.  Pt Education - Pamphlet Given -  Laparoscopic Gallbladder Surgery: discussed with patient and provided information. The anatomy & physiology of hepatobiliary & pancreatic function was discussed. The pathophysiology of gallbladder dysfunction was discussed. Natural history risks without surgery was discussed. I feel the risks of no intervention will lead to serious problems that outweigh the operative risks; therefore, I recommended cholecystectomy to remove the pathology. I explained laparoscopic techniques with possible need for an open approach. Probable cholangiogram to evaluate the bilary tract was explained as well.  Risks such as bleeding, infection, abscess, leak, injury to other organs, need for further treatment, heart attack, death, and other risks were discussed. I noted a good likelihood this will help address the problem. Possibility that this will not correct all abdominal symptoms was explained. Goals of post-operative recovery were discussed as well. We will work to minimize complications. An educational handout further explaining the pathology and treatment options was given as well. Questions were answered. The patient expresses understanding & wishes to proceed with surgery.  Pt Education - [facility] Laparosopic Post Op HCI ([place]ross) Pt Education - Laparoscopic Cholecystectomy: gallbladder

## 2018-07-09 ENCOUNTER — Encounter (HOSPITAL_BASED_OUTPATIENT_CLINIC_OR_DEPARTMENT_OTHER): Payer: Self-pay | Admitting: *Deleted

## 2018-07-09 ENCOUNTER — Other Ambulatory Visit: Payer: Self-pay

## 2018-07-13 ENCOUNTER — Other Ambulatory Visit: Payer: Self-pay

## 2018-07-13 ENCOUNTER — Encounter (HOSPITAL_BASED_OUTPATIENT_CLINIC_OR_DEPARTMENT_OTHER)
Admission: RE | Admit: 2018-07-13 | Discharge: 2018-07-13 | Disposition: A | Discharge status: Home / Self Care | Payer: 59 | Source: Ambulatory Visit | Attending: Surgery | Admitting: Surgery

## 2018-07-13 DIAGNOSIS — Z886 Allergy status to analgesic agent status: Secondary | ICD-10-CM | POA: Diagnosis not present

## 2018-07-13 DIAGNOSIS — Z01812 Encounter for preprocedural laboratory examination: Secondary | ICD-10-CM | POA: Insufficient documentation

## 2018-07-13 DIAGNOSIS — Z809 Family history of malignant neoplasm, unspecified: Secondary | ICD-10-CM | POA: Diagnosis not present

## 2018-07-13 DIAGNOSIS — Z8261 Family history of arthritis: Secondary | ICD-10-CM | POA: Diagnosis not present

## 2018-07-13 DIAGNOSIS — Z818 Family history of other mental and behavioral disorders: Secondary | ICD-10-CM | POA: Diagnosis not present

## 2018-07-13 DIAGNOSIS — Z836 Family history of other diseases of the respiratory system: Secondary | ICD-10-CM | POA: Diagnosis not present

## 2018-07-13 DIAGNOSIS — F419 Anxiety disorder, unspecified: Secondary | ICD-10-CM | POA: Diagnosis not present

## 2018-07-13 DIAGNOSIS — Z87891 Personal history of nicotine dependence: Secondary | ICD-10-CM | POA: Diagnosis not present

## 2018-07-13 DIAGNOSIS — Z888 Allergy status to other drugs, medicaments and biological substances status: Secondary | ICD-10-CM | POA: Diagnosis not present

## 2018-07-13 DIAGNOSIS — K8010 Calculus of gallbladder with chronic cholecystitis without obstruction: Secondary | ICD-10-CM | POA: Diagnosis not present

## 2018-07-13 DIAGNOSIS — Z823 Family history of stroke: Secondary | ICD-10-CM | POA: Diagnosis not present

## 2018-07-13 DIAGNOSIS — Z8249 Family history of ischemic heart disease and other diseases of the circulatory system: Secondary | ICD-10-CM | POA: Diagnosis not present

## 2018-07-13 DIAGNOSIS — Z8049 Family history of malignant neoplasm of other genital organs: Secondary | ICD-10-CM | POA: Diagnosis not present

## 2018-07-13 DIAGNOSIS — Z91040 Latex allergy status: Secondary | ICD-10-CM | POA: Diagnosis not present

## 2018-07-13 DIAGNOSIS — K828 Other specified diseases of gallbladder: Secondary | ICD-10-CM | POA: Diagnosis not present

## 2018-07-13 LAB — CBC WITH DIFFERENTIAL/PLATELET
Abs Immature Granulocytes: 0.02 10*3/uL (ref 0.00–0.07)
Basophils Absolute: 0 10*3/uL (ref 0.0–0.1)
Basophils Relative: 0 %
Eosinophils Absolute: 0 10*3/uL (ref 0.0–0.5)
Eosinophils Relative: 1 %
HCT: 38.5 % (ref 36.0–46.0)
Hemoglobin: 13.1 g/dL (ref 12.0–15.0)
Immature Granulocytes: 0 %
Lymphocytes Relative: 32 %
Lymphs Abs: 2.9 10*3/uL (ref 0.7–4.0)
MCH: 29.8 pg (ref 26.0–34.0)
MCHC: 34 g/dL (ref 30.0–36.0)
MCV: 87.5 fL (ref 80.0–100.0)
Monocytes Absolute: 0.4 10*3/uL (ref 0.1–1.0)
Monocytes Relative: 4 %
Neutro Abs: 5.5 10*3/uL (ref 1.7–7.7)
Neutrophils Relative %: 63 %
Platelets: 287 10*3/uL (ref 150–400)
RBC: 4.4 MIL/uL (ref 3.87–5.11)
RDW: 11.4 % — ABNORMAL LOW (ref 11.5–15.5)
WBC: 8.8 10*3/uL (ref 4.0–10.5)
nRBC: 0 % (ref 0.0–0.2)

## 2018-07-13 LAB — COMPREHENSIVE METABOLIC PANEL
ALT: 16 U/L (ref 0–44)
AST: 19 U/L (ref 15–41)
Albumin: 4.1 g/dL (ref 3.5–5.0)
Alkaline Phosphatase: 68 U/L (ref 38–126)
Anion gap: 11 (ref 5–15)
BUN: 15 mg/dL (ref 6–20)
CO2: 22 mmol/L (ref 22–32)
Calcium: 9.1 mg/dL (ref 8.9–10.3)
Chloride: 103 mmol/L (ref 98–111)
Creatinine, Ser: 0.73 mg/dL (ref 0.44–1.00)
GFR calc Af Amer: >60 mL/min (ref >60)
GFR calc non Af Amer: >60 mL/min (ref >60)
Glucose, Bld: 134 mg/dL — ABNORMAL HIGH (ref 70–99)
Potassium: 3.9 mmol/L (ref 3.5–5.1)
Sodium: 136 mmol/L (ref 135–145)
Total Bilirubin: 0.7 mg/dL (ref 0.3–1.2)
Total Protein: 7.1 g/dL (ref 6.5–8.1)

## 2018-07-13 MED FILL — busPIRone HCL 15 MG TABS: 15 | 90 days supply | Qty: 180 | Fill #0

## 2018-07-16 ENCOUNTER — Ambulatory Visit (HOSPITAL_BASED_OUTPATIENT_CLINIC_OR_DEPARTMENT_OTHER): Payer: 59 | Admitting: Anesthesiology

## 2018-07-16 ENCOUNTER — Ambulatory Visit (HOSPITAL_BASED_OUTPATIENT_CLINIC_OR_DEPARTMENT_OTHER)
Admission: RE | Admit: 2018-07-16 | Discharge: 2018-07-16 | Disposition: A | Discharge status: Home / Self Care | Payer: 59 | Attending: Surgery | Admitting: Surgery

## 2018-07-16 ENCOUNTER — Encounter (HOSPITAL_BASED_OUTPATIENT_CLINIC_OR_DEPARTMENT_OTHER)
Admission: RE | Disposition: A | Discharge status: Home / Self Care | Payer: Self-pay | Source: Home / Self Care | Attending: Surgery

## 2018-07-16 ENCOUNTER — Encounter (HOSPITAL_BASED_OUTPATIENT_CLINIC_OR_DEPARTMENT_OTHER): Payer: Self-pay

## 2018-07-16 DIAGNOSIS — Z8249 Family history of ischemic heart disease and other diseases of the circulatory system: Secondary | ICD-10-CM | POA: Insufficient documentation

## 2018-07-16 DIAGNOSIS — Z836 Family history of other diseases of the respiratory system: Secondary | ICD-10-CM | POA: Insufficient documentation

## 2018-07-16 DIAGNOSIS — Z888 Allergy status to other drugs, medicaments and biological substances status: Secondary | ICD-10-CM | POA: Diagnosis not present

## 2018-07-16 DIAGNOSIS — K8070 Calculus of gallbladder and bile duct without cholecystitis without obstruction: Secondary | ICD-10-CM | POA: Diagnosis not present

## 2018-07-16 DIAGNOSIS — Z886 Allergy status to analgesic agent status: Secondary | ICD-10-CM | POA: Diagnosis not present

## 2018-07-16 DIAGNOSIS — Z8261 Family history of arthritis: Secondary | ICD-10-CM | POA: Diagnosis not present

## 2018-07-16 DIAGNOSIS — N739 Female pelvic inflammatory disease, unspecified: Secondary | ICD-10-CM | POA: Diagnosis not present

## 2018-07-16 DIAGNOSIS — Z809 Family history of malignant neoplasm, unspecified: Secondary | ICD-10-CM | POA: Diagnosis not present

## 2018-07-16 DIAGNOSIS — Z91040 Latex allergy status: Secondary | ICD-10-CM | POA: Diagnosis not present

## 2018-07-16 DIAGNOSIS — K828 Other specified diseases of gallbladder: Secondary | ICD-10-CM | POA: Diagnosis not present

## 2018-07-16 DIAGNOSIS — K8010 Calculus of gallbladder with chronic cholecystitis without obstruction: Secondary | ICD-10-CM | POA: Diagnosis not present

## 2018-07-16 DIAGNOSIS — Z87891 Personal history of nicotine dependence: Secondary | ICD-10-CM | POA: Diagnosis not present

## 2018-07-16 DIAGNOSIS — Z8049 Family history of malignant neoplasm of other genital organs: Secondary | ICD-10-CM | POA: Insufficient documentation

## 2018-07-16 DIAGNOSIS — F419 Anxiety disorder, unspecified: Secondary | ICD-10-CM | POA: Insufficient documentation

## 2018-07-16 DIAGNOSIS — Z818 Family history of other mental and behavioral disorders: Secondary | ICD-10-CM | POA: Insufficient documentation

## 2018-07-16 DIAGNOSIS — D069 Carcinoma in situ of cervix, unspecified: Secondary | ICD-10-CM | POA: Diagnosis not present

## 2018-07-16 DIAGNOSIS — Z823 Family history of stroke: Secondary | ICD-10-CM | POA: Insufficient documentation

## 2018-07-16 HISTORY — DX: Anxiety disorder, unspecified: F41.9

## 2018-07-16 HISTORY — DX: Malignant (primary) neoplasm, unspecified: C80.1

## 2018-07-16 HISTORY — DX: Anemia, unspecified: D64.9

## 2018-07-16 HISTORY — PX: CHOLECYSTECTOMY: SHX55

## 2018-07-16 SURGERY — LAPAROSCOPIC CHOLECYSTECTOMY
Anesthesia: General | Site: Abdomen

## 2018-07-16 MED ORDER — LACTATED RINGERS IV SOLN
INTRAVENOUS | Status: DC
  Administered 2018-07-16 (×2): via INTRAVENOUS

## 2018-07-16 MED ORDER — CHLORHEXIDINE GLUCONATE CLOTH 2 % EX PADS: 6.0000 | MEDICATED_PAD | Freq: Once | CUTANEOUS | Status: DC

## 2018-07-16 MED ORDER — BUPIVACAINE-EPINEPHRINE 0.25% -1:200000 IJ SOLN
INTRAMUSCULAR | Status: DC | PRN
  Administered 2018-07-16: 20 mL

## 2018-07-16 MED ORDER — PROPOFOL 10 MG/ML IV BOLUS
INTRAVENOUS | Status: AC
  Filled 2018-07-16: qty 20

## 2018-07-16 MED ORDER — OXYCODONE HCL 5 MG PO TABS: 5.0000 mg | ORAL_TABLET | Freq: Four times a day (QID) | ORAL | 0 refills | Status: DC | PRN

## 2018-07-16 MED ORDER — EPHEDRINE SULFATE-NACL 50-0.9 MG/10ML-% IV SOSY
PREFILLED_SYRINGE | INTRAVENOUS | Status: DC | PRN
  Administered 2018-07-16: 10 mg via INTRAVENOUS

## 2018-07-16 MED ORDER — SCOPOLAMINE 1 MG/3DAYS TD PT72
MEDICATED_PATCH | TRANSDERMAL | Status: AC
  Filled 2018-07-16: qty 1

## 2018-07-16 MED ORDER — FENTANYL CITRATE (PF) 100 MCG/2ML IJ SOLN
INTRAMUSCULAR | Status: AC
  Filled 2018-07-16: qty 2

## 2018-07-16 MED ORDER — ACETAMINOPHEN 10 MG/ML IV SOLN
INTRAVENOUS | Status: AC
  Filled 2018-07-16: qty 100

## 2018-07-16 MED ORDER — CEFOTETAN DISODIUM-DEXTROSE 2-2.08 GM-%(50ML) IV SOLR
INTRAVENOUS | Status: AC
  Filled 2018-07-16: qty 50

## 2018-07-16 MED ORDER — GABAPENTIN 300 MG PO CAPS
ORAL_CAPSULE | ORAL | Status: AC
  Filled 2018-07-16: qty 1

## 2018-07-16 MED ORDER — KETOROLAC TROMETHAMINE 30 MG/ML IJ SOLN
30.0000 mg | Freq: Once | INTRAMUSCULAR | Status: AC
  Administered 2018-07-16: 16:00:00 30 mg via INTRAVENOUS

## 2018-07-16 MED ORDER — ONDANSETRON HCL 4 MG/2ML IJ SOLN
INTRAMUSCULAR | Status: DC | PRN
  Administered 2018-07-16: 4 mg via INTRAVENOUS

## 2018-07-16 MED ORDER — BUPIVACAINE-EPINEPHRINE (PF) 0.25% -1:200000 IJ SOLN
INTRAMUSCULAR | Status: AC
  Filled 2018-07-16: qty 30

## 2018-07-16 MED ORDER — EPHEDRINE 5 MG/ML INJ
INTRAVENOUS | Status: AC
  Filled 2018-07-16: qty 10

## 2018-07-16 MED ORDER — MIDAZOLAM HCL 2 MG/2ML IJ SOLN
INTRAMUSCULAR | Status: AC
  Filled 2018-07-16: qty 2

## 2018-07-16 MED ORDER — LIDOCAINE 2% (20 MG/ML) 5 ML SYRINGE
INTRAMUSCULAR | Status: AC
  Filled 2018-07-16: qty 5

## 2018-07-16 MED ORDER — SODIUM CHLORIDE 0.9 % IR SOLN
Status: DC | PRN
  Administered 2018-07-16: 2000 mL

## 2018-07-16 MED ORDER — KETOROLAC TROMETHAMINE 30 MG/ML IJ SOLN
INTRAMUSCULAR | Status: AC
  Filled 2018-07-16: qty 1

## 2018-07-16 MED ORDER — KETOROLAC TROMETHAMINE 30 MG/ML IJ SOLN: INTRAMUSCULAR | Status: DC | PRN

## 2018-07-16 MED ORDER — PROPOFOL 10 MG/ML IV BOLUS
INTRAVENOUS | Status: DC | PRN
  Administered 2018-07-16: 170 mg via INTRAVENOUS

## 2018-07-16 MED ORDER — FENTANYL CITRATE (PF) 100 MCG/2ML IJ SOLN
50.0000 ug | INTRAMUSCULAR | Status: AC | PRN
  Administered 2018-07-16: 25 ug via INTRAVENOUS
  Administered 2018-07-16 (×2): 50 ug via INTRAVENOUS
  Administered 2018-07-16: 100 ug via INTRAVENOUS
  Administered 2018-07-16 (×3): 25 ug via INTRAVENOUS

## 2018-07-16 MED ORDER — ROCURONIUM BROMIDE 10 MG/ML (PF) SYRINGE
PREFILLED_SYRINGE | INTRAVENOUS | Status: DC | PRN
  Administered 2018-07-16: 10 mg via INTRAVENOUS
  Administered 2018-07-16: 50 mg via INTRAVENOUS

## 2018-07-16 MED ORDER — ACETAMINOPHEN 10 MG/ML IV SOLN
1000.0000 mg | Freq: Once | INTRAVENOUS | Status: AC
  Administered 2018-07-16: 1000 mg via INTRAVENOUS

## 2018-07-16 MED ORDER — GABAPENTIN 300 MG PO CAPS
300.0000 mg | ORAL_CAPSULE | ORAL | Status: AC
  Administered 2018-07-16: 300 mg via ORAL

## 2018-07-16 MED ORDER — MIDAZOLAM HCL 2 MG/2ML IJ SOLN
1.0000 mg | INTRAMUSCULAR | Status: DC | PRN
  Administered 2018-07-16: 2 mg via INTRAVENOUS

## 2018-07-16 MED ORDER — FENTANYL CITRATE (PF) 100 MCG/2ML IJ SOLN
25.0000 ug | INTRAMUSCULAR | Status: DC | PRN
  Administered 2018-07-16: 50 ug via INTRAVENOUS

## 2018-07-16 MED ORDER — SCOPOLAMINE 1 MG/3DAYS TD PT72
1.0000 | MEDICATED_PATCH | Freq: Once | TRANSDERMAL | Status: DC | PRN
  Administered 2018-07-16: 1.5 mg via TRANSDERMAL

## 2018-07-16 MED ORDER — SODIUM CHLORIDE 0.9 % IV SOLN
2.0000 g | INTRAVENOUS | Status: AC
  Administered 2018-07-16: 2 g via INTRAVENOUS

## 2018-07-16 MED ORDER — OXYCODONE HCL 5 MG/5ML PO SOLN: 5.0000 mg | Freq: Once | ORAL | Status: DC | PRN

## 2018-07-16 MED ORDER — ARTIFICIAL TEARS OPHTHALMIC OINT
TOPICAL_OINTMENT | OPHTHALMIC | Status: AC
  Filled 2018-07-16: qty 3.5

## 2018-07-16 MED ORDER — DEXAMETHASONE SODIUM PHOSPHATE 10 MG/ML IJ SOLN
INTRAMUSCULAR | Status: DC | PRN
  Administered 2018-07-16: 5 mg via INTRAVENOUS

## 2018-07-16 MED ORDER — ONDANSETRON HCL 4 MG PO TABS: 4.0000 mg | ORAL_TABLET | Freq: Every day | ORAL | 1 refills | Status: DC | PRN

## 2018-07-16 MED ORDER — DEXAMETHASONE SODIUM PHOSPHATE 10 MG/ML IJ SOLN
INTRAMUSCULAR | Status: AC
  Filled 2018-07-16: qty 1

## 2018-07-16 MED ORDER — PROMETHAZINE HCL 25 MG/ML IJ SOLN: 6.2500 mg | INTRAMUSCULAR | Status: DC | PRN

## 2018-07-16 MED ORDER — OXYCODONE HCL 5 MG PO TABS: 5.0000 mg | ORAL_TABLET | Freq: Once | ORAL | Status: DC | PRN

## 2018-07-16 MED ORDER — ACETAMINOPHEN 10 MG/ML IV SOLN: 1000.0000 mg | Freq: Once | INTRAVENOUS | Status: DC | PRN

## 2018-07-16 MED ORDER — SUGAMMADEX SODIUM 200 MG/2ML IV SOLN
INTRAVENOUS | Status: DC | PRN
  Administered 2018-07-16: 180 mg via INTRAVENOUS

## 2018-07-16 MED ORDER — ONDANSETRON HCL 4 MG/2ML IJ SOLN
INTRAMUSCULAR | Status: AC
  Filled 2018-07-16: qty 2

## 2018-07-16 MED ORDER — ROCURONIUM BROMIDE 50 MG/5ML IV SOSY
PREFILLED_SYRINGE | INTRAVENOUS | Status: AC
  Filled 2018-07-16: qty 5

## 2018-07-16 MED ORDER — SUGAMMADEX SODIUM 500 MG/5ML IV SOLN
INTRAVENOUS | Status: AC
  Filled 2018-07-16: qty 5

## 2018-07-16 SURGICAL SUPPLY — 39 items
APPLIER CLIP ROT 10 11.4 M/L (STAPLE) ×3
BLADE CLIPPER SURG (BLADE) IMPLANT
CANISTER SUCT 1200ML W/VALVE (MISCELLANEOUS) ×3 IMPLANT
CHLORAPREP W/TINT 26 (MISCELLANEOUS) ×3 IMPLANT
CLIP APPLIE ROT 10 11.4 M/L (STAPLE) ×2 IMPLANT
COVER MAYO STAND REUSABLE (DRAPES) ×3 IMPLANT
COVER WAND RF STERILE (DRAPES) IMPLANT
DECANTER SPIKE VIAL GLASS SM (MISCELLANEOUS) IMPLANT
DERMABOND ADVANCED (GAUZE/BANDAGES/DRESSINGS) ×1
DERMABOND ADVANCED .7 DNX12 (GAUZE/BANDAGES/DRESSINGS) ×2 IMPLANT
DRAPE LAPAROSCOPIC ABDOMINAL (DRAPES) ×3 IMPLANT
ELECT REM PT RETURN 9FT ADLT (ELECTROSURGICAL) ×3
ELECTRODE REM PT RTRN 9FT ADLT (ELECTROSURGICAL) ×2 IMPLANT
FILTER SMOKE EVAC LAPAROSHD (FILTER) IMPLANT
GLOVE BIO SURGEON STRL SZ8 (GLOVE) ×3 IMPLANT
GLOVE BIOGEL PI IND STRL 8 (GLOVE) ×2 IMPLANT
GLOVE BIOGEL PI INDICATOR 8 (GLOVE) ×1
GOWN STRL REUS W/ TWL LRG LVL3 (GOWN DISPOSABLE) ×6 IMPLANT
GOWN STRL REUS W/TWL LRG LVL3 (GOWN DISPOSABLE) ×3
HEMOSTAT SNOW SURGICEL 2X4 (HEMOSTASIS) IMPLANT
LINER CANISTER 1000CC FLEX (MISCELLANEOUS) ×3 IMPLANT
NS IRRIG 1000ML POUR BTL (IV SOLUTION) IMPLANT
PACK BASIN DAY SURGERY FS (CUSTOM PROCEDURE TRAY) ×3 IMPLANT
POUCH SPECIMEN RETRIEVAL 10MM (ENDOMECHANICALS) ×3 IMPLANT
SCISSORS LAP 5X35 DISP (ENDOMECHANICALS) ×3 IMPLANT
SET CHOLANGIOGRAPH 5 50 .035 (SET/KITS/TRAYS/PACK) ×3 IMPLANT
SET IRRIG TUBING LAPAROSCOPIC (IRRIGATION / IRRIGATOR) ×3 IMPLANT
SET TUBE SMOKE EVAC HIGH FLOW (TUBING) ×3 IMPLANT
SLEEVE ENDOPATH XCEL 5M (ENDOMECHANICALS) ×3 IMPLANT
SLEEVE SCD COMPRESS KNEE MED (MISCELLANEOUS) ×3 IMPLANT
STRIP CLOSURE SKIN 1/2X4 (GAUZE/BANDAGES/DRESSINGS) IMPLANT
SUT MNCRL AB 4-0 PS2 18 (SUTURE) ×3 IMPLANT
SUT VICRYL 0 UR6 27IN ABS (SUTURE) IMPLANT
TOWEL GREEN STERILE FF (TOWEL DISPOSABLE) ×3 IMPLANT
TRAY LAPAROSCOPIC (CUSTOM PROCEDURE TRAY) ×3 IMPLANT
TROCAR XCEL BLUNT TIP 100MML (ENDOMECHANICALS) ×3 IMPLANT
TROCAR XCEL NON-BLD 11X100MML (ENDOMECHANICALS) ×3 IMPLANT
TROCAR XCEL NON-BLD 5MMX100MML (ENDOMECHANICALS) ×3 IMPLANT
TUBE CONNECTING 20X1/4 (TUBING) ×3 IMPLANT

## 2018-07-16 NOTE — Discharge Instructions (Signed)
Return to Work ___________________________4/16/2020________________________ was treated at our facility. Injury or illness was: ___Work-related. ___Not work-related. ___Undetermined if work-related. Return to work  Employee may return to work on ____4/20/2020__________________.  Employee may return to modified work on ______________________. Work activity restrictions No lifting more than 20 lbs for 2 weeks  May do desk work and work that does not involve lifting pushing or pulling for 2 weeks  Ok to drive and shower on Friday            [facility] ______[facility] LAPAROSCOPIC SURGERY: POST OP INSTRUCTIONS Always review your discharge instruction sheet given to you by the facility where your surgery was performed. IF YOU HAVE DISABILITY OR FAMILY LEAVE FORMS, YOU MUST BRING THEM TO THE OFFICE FOR PROCESSING.   DO NOT GIVE THEM TO YOUR DOCTOR.  1. A prescription for pain medication may be given to you upon discharge.  Take your pain medication as prescribed, if needed.  If narcotic pain medicine is not needed, then you may take acetaminophen (Tylenol) or ibuprofen (Advil) as needed. 2. Take your usually prescribed medications unless otherwise directed. 3. If you need a refill on your pain medication, please contact your pharmacy.  They will contact our office to request authorization. Prescriptions will not be filled after 5pm or on week-ends. 4. You should follow a light diet the first few days after arrival home, such as soup and crackers, etc.  Be sure to include lots of fluids daily. 5. Most patients will experience some swelling and bruising in the area of the incisions.  Ice packs will help.  Swelling and bruising can take several days to resolve.  6. It is common to experience some constipation if taking pain medication after surgery.  Increasing fluid intake and taking a stool softener (such as Colace) will usually help or prevent this problem from occurring.   A mild laxative (Milk of Magnesia or Miralax) should be taken according to package instructions if there are no bowel movements after 48 hours. 7. Unless discharge instructions indicate otherwise, you may remove your bandages 24-48 hours after surgery, and you may shower at that time.  You may have steri-strips (small skin tapes) in place directly over the incision.  These strips should be left on the skin for 7-10 days.  If your surgeon used skin glue on the incision, you may shower in 24 hours.  The glue will flake off over the next 2-3 weeks.  Any sutures or staples will be removed at the office during your follow-up visit. 8. ACTIVITIES:  You may resume regular (light) daily activities beginning the next day--such as daily self-care, walking, climbing stairs--gradually increasing activities as tolerated.  You may have sexual intercourse when it is comfortable.  Refrain from any heavy lifting or straining until approved by your doctor. a. You may drive when you are no longer taking prescription pain medication, you can comfortably wear a seatbelt, and you can safely maneuver your car and apply brakes. b. RETURN TO WORK:  __________________________________________________________ 9. You should see your doctor in the office for a follow-up appointment approximately 2-3 weeks after your surgery.  Make sure that you call for this appointment within a day or two after you arrive home to insure a convenient appointment time. 10. OTHER INSTRUCTIONS: __________________________________________________________________________________________________________________________ __________________________________________________________________________________________________________________________ WHEN TO CALL YOUR DOCTOR: 1. Fever over 101.0 2. Inability to urinate 3. Continued bleeding from incision. 4. Increased pain, redness, or drainage from the incision. 5. Increasing abdominal pain  The clinic  staff is  available to answer your questions during regular business hours.  Please dont hesitate to call and ask to speak to one of the nurses for clinical concerns.  If you have a medical emergency, go to the nearest emergency room or call 911.  A surgeon from [facility] is always on call at the hospital. [address], [place], [place], [place]  [address] ? [address], [place], [place]   [address] [phone] ? [phone] ? FAX [phone] Web site: [email or web address]    Post Anesthesia Home Care Instructions  Activity: Get plenty of rest for the remainder of the day. A responsible individual must stay with you for 24 hours following the procedure.  For the next 24 hours, DO NOT: -Drive a car -Paediatric nurse -Drink alcoholic beverages -Take any medication unless instructed by your physician -Make any legal decisions or sign important papers.  Meals: Start with liquid foods such as gelatin or soup. Progress to regular foods as tolerated. Avoid greasy, spicy, heavy foods. If nausea and/or vomiting occur, drink only clear liquids until the nausea and/or vomiting subsides. Call your physician if vomiting continues.  Special Instructions/Symptoms: Your throat may feel dry or sore from the anesthesia or the breathing tube placed in your throat during surgery. If this causes discomfort, gargle with warm salt water. The discomfort should disappear within 24 hours.  If you had a scopolamine patch placed behind your ear for the management of post- operative nausea and/or vomiting:  1. The medication in the patch is effective for 72 hours, after which it should be removed.  Wrap patch in a tissue and discard in the trash. Wash hands thoroughly with soap and water. 2. You may remove the patch earlier than 72 hours if you experience unpleasant side effects which may include dry mouth, dizziness or visual disturbances. 3. Avoid touching the patch. Wash your hands  with soap and water after contact with the patch.

## 2018-07-16 NOTE — Anesthesia Preprocedure Evaluation (Signed)
Anesthesia Evaluation  Patient identified by MRN, date of birth, ID band Patient awake    Reviewed: Allergy & Precautions, NPO status , Patient's Chart, lab work & pertinent test results  Airway Mallampati: II  TM Distance: >3 FB Neck ROM: Full    Dental no notable dental hx.    Pulmonary neg pulmonary ROS, former smoker,    Pulmonary exam normal breath sounds clear to auscultation       Cardiovascular negative cardio ROS Normal cardiovascular exam Rhythm:Regular Rate:Normal     Neuro/Psych Anxiety negative neurological ROS     GI/Hepatic Neg liver ROS, GERD  ,  Endo/Other  negative endocrine ROS  Renal/GU negative Renal ROS  negative genitourinary   Musculoskeletal negative musculoskeletal ROS (+)   Abdominal   Peds negative pediatric ROS (+)  Hematology negative hematology ROS (+)   Anesthesia Other Findings   Reproductive/Obstetrics negative OB ROS                             Anesthesia Physical Anesthesia Plan  ASA: II  Anesthesia Plan: General   Post-op Pain Management:    Induction: Intravenous  PONV Risk Score and Plan: 3 and Ondansetron, Dexamethasone, Treatment may vary due to age or medical condition and Midazolam  Airway Management Planned: Oral ETT  Additional Equipment:   Intra-op Plan:   Post-operative Plan: Extubation in OR  Informed Consent: I have reviewed the patients History and Physical, chart, labs and discussed the procedure including the risks, benefits and alternatives for the proposed anesthesia with the patient or authorized representative who has indicated his/her understanding and acceptance.     Dental advisory given  Plan Discussed with: CRNA and Surgeon  Anesthesia Plan Comments:         Anesthesia Quick Evaluation

## 2018-07-16 NOTE — Op Note (Signed)
Laparoscopic Cholecystectomy Procedure Note  Indications: This patient presents with symptomatic gallbladder disease and will undergo laparoscopic cholecystectomy.The procedure has been discussed with the patient. Operative and non operative treatments have been discussed. Risks of surgery include bleeding, infection,  Common bile duct injury,  Injury to the stomach,liver, colon,small intestine, abdominal wall,  Diaphragm,  Major blood vessels,  And the need for an open procedure.  Other risks include worsening of medical problems, death,  DVT and pulmonary embolism, and cardiovascular events.   Medical options have also been discussed. The patient has been informed of long term expectations of surgery and non surgical options,  The patient agrees to proceed.    Pre-operative Diagnosis: Calculus of gallbladder without mention of cholecystitis or obstruction  Post-operative Diagnosis: Same  Surgeon: Joyice Faster Davey Limas   Assistants: OR staff   Anesthesia: General endotracheal anesthesia and Local anesthesia 0.25.% bupivacaine, with epinephrine  ASA Class: 2  Procedure Details  The patient was seen again in the Holding Room. The risks, benefits, complications, treatment options, and expected outcomes were discussed with the patient. The possibilities of reaction to medication, pulmonary aspiration, perforation of viscus, bleeding, recurrent infection, finding a normal gallbladder, the need for additional procedures, failure to diagnose a condition, the possible need to convert to an open procedure, and creating a complication requiring transfusion or operation were discussed with the patient. The patient and/or family concurred with the proposed plan, giving informed consent. The site of surgery properly noted/marked. The patient was taken to Operating Room, identified as Brittney Black and the procedure verified as Laparoscopic Cholecystectomy with Intraoperative Cholangiograms. A Time Out was held and  the above information confirmed.  Prior to the induction of general anesthesia, antibiotic prophylaxis was administered. General endotracheal anesthesia was then administered and tolerated well. After the induction, the abdomen was prepped in the usual sterile fashion. The patient was positioned in the supine position with the left arm comfortably tucked, along with some reverse Trendelenburg.  Local anesthetic agent was injected into the skin near the umbilicus and an incision made. The midline fascia was incised and the Hasson technique was used to introduce a 12 mm port under direct vision. It was secured with a figure of eight Vicryl suture placed in the usual fashion. Pneumoperitoneum was then created with CO2 and tolerated well without any adverse changes in the patient's vital signs. Additional trocars were introduced under direct vision with an 11 mm trocar in the epigastrium and 2 5 mm trocars in the right upper quadrant. All skin incisions were infiltrated with a local anesthetic agent before making the incision and placing the trocars.   The gallbladder was identified, the fundus grasped and retracted cephalad. Adhesions were lysed bluntly and with the electrocautery where indicated, taking care not to injure any adjacent organs or viscus. The infundibulum was grasped and retracted laterally, exposing the peritoneum overlying the triangle of Calot. This was then divided and exposed in a blunt fashion. The cystic duct was clearly identified and bluntly dissected circumferentially. The junctions of the gallbladder, cystic duct and common bile duct were clearly identified prior to the division of any linear structure.   The cystic duct was quite small.  I did not think it would accommodate a cholangiogram catheter.  Her preoperative common bile duct was not dilated on ultrasound her liver function studies were normal.  A cholangiogram was not performed  The cystic duct was then  ligated with  surgical clips  on the patient side and  clipped on the gallbladder side and divided. The cystic artery was identified, dissected free, ligated with clips and divided as well. Posterior cystic artery clipped and divided.  The gallbladder was dissected from the liver bed in retrograde fashion with the electrocautery. The gallbladder was removed. The liver bed was irrigated and inspected. Hemostasis was achieved with the electrocautery. Copious irrigation was utilized and was repeatedly aspirated until clear all particulate matter. Hemostasis was achieved with no signs of bleeding or bile leakage.  Pneumoperitoneum was completely reduced after viewing removal of the trocars under direct vision. The wound was thoroughly irrigated and the fascia was then closed with a figure of eight suture; the skin was then closed with 4-0 Monocryl and a sterile dressing was applied of Dermabond  Instrument, sponge, and needle counts were correct at closure and at the conclusion of the case.   Findings:  Cholelithiasis  Estimated Blood Loss: less than 50 mL         Drains: None         Total IV Fluids: Per record         Specimens: Gallbladder           Complications: None; patient tolerated the procedure well.         Disposition: PACU - hemodynamically stable.         Condition: stable

## 2018-07-16 NOTE — Transfer of Care (Signed)
Immediate Anesthesia Transfer of Care Note  Patient: Brittney Black  Procedure(s) Performed: Procedure(s) (LRB): LAPAROSCOPIC CHOLECYSTECTOMY (N/A)  Patient Location: PACU  Anesthesia Type: General  Level of Consciousness: awake, oriented, sedated and patient cooperative  Airway & Oxygen Therapy: Patient Spontanous Breathing and Patient connected to face mask oxygen  Post-op Assessment: Report given to PACU RN and Post -op Vital signs reviewed and stable  Post vital signs: Reviewed and stable  Complications: No apparent anesthesia complications  Last Vitals:  Vitals Value Taken Time  BP 133/86 07/16/2018  3:33 PM  Temp 37 C 07/16/2018  3:33 PM  Pulse 93 07/16/2018  3:38 PM  Resp 20 07/16/2018  3:38 PM  SpO2 100 % 07/16/2018  3:38 PM  Vitals shown include unvalidated device data.  Last Pain:  Vitals:   07/16/18 1115  TempSrc: Oral  PainSc: 5       Patients Stated Pain Goal: 5 (07/16/18 1115)

## 2018-07-16 NOTE — Anesthesia Procedure Notes (Signed)
Procedure Name: Intubation Date/Time: 07/16/2018 2:21 PM Performed by: Wanita Chamberlain, CRNA Pre-anesthesia Checklist: Patient identified, Emergency Drugs available, Suction available, Patient being monitored and Timeout performed Patient Re-evaluated:Patient Re-evaluated prior to induction Oxygen Delivery Method: Circle system utilized Preoxygenation: Pre-oxygenation with 100% oxygen Induction Type: IV induction Ventilation: Mask ventilation without difficulty Laryngoscope Size: Mac and 3 Grade View: Grade I Tube type: Oral Tube size: 7.0 mm Number of attempts: 1 Airway Equipment and Method: Stylet Placement Confirmation: positive ETCO2,  breath sounds checked- equal and bilateral,  CO2 detector and ETT inserted through vocal cords under direct vision Secured at: 20 cm Tube secured with: Tape Dental Injury: Teeth and Oropharynx as per pre-operative assessment

## 2018-07-16 NOTE — Interval H&P Note (Signed)
History and Physical Interval Note:  07/16/2018 12:43 PM  Brittney Black  has presented today for surgery, with the diagnosis of biliary colic.  The various methods of treatment have been discussed with the patient and family. After consideration of risks, benefits and other options for treatment, the patient has consented to  Procedure(s): [place] CHOLANGIOGRAM (N/A) as a surgical intervention.  The patient's history has been reviewed, patient examined, no change in status, stable for surgery.  I have reviewed the patient's chart and labs.  Questions were answered to the patient's satisfaction.     [place]

## 2018-07-17 ENCOUNTER — Encounter (HOSPITAL_BASED_OUTPATIENT_CLINIC_OR_DEPARTMENT_OTHER): Payer: Self-pay | Admitting: Surgery

## 2018-07-18 NOTE — Anesthesia Postprocedure Evaluation (Signed)
Anesthesia Post Note  Patient: Brittney Black  Procedure(s) Performed: LAPAROSCOPIC CHOLECYSTECTOMY (N/A Abdomen)     Patient location during evaluation: PACU Anesthesia Type: General Level of consciousness: awake and alert Pain management: pain level controlled Vital Signs Assessment: post-procedure vital signs reviewed and stable Respiratory status: spontaneous breathing, nonlabored ventilation, respiratory function stable and patient connected to nasal cannula oxygen Cardiovascular status: blood pressure returned to baseline and stable Postop Assessment: no apparent nausea or vomiting Anesthetic complications: no    Last Vitals:  Vitals:   07/16/18 1630 07/16/18 1645  BP: 120/76 122/79  Pulse: 78   Resp: 19 20  Temp: 36.5 C 36.7 C  SpO2: 100% 100%    Last Pain:  Vitals:   07/16/18 1645  TempSrc:   PainSc: 3                  Hunter Bachar S

## 2018-08-21 DIAGNOSIS — H52223 Regular astigmatism, bilateral: Secondary | ICD-10-CM | POA: Diagnosis not present

## 2018-08-25 DIAGNOSIS — G43109 Migraine with aura, not intractable, without status migrainosus: Secondary | ICD-10-CM | POA: Diagnosis not present

## 2018-08-25 MED FILL — RIZATRIPTAN 10 MG ODT: 10 | 30 days supply | Qty: 12 | Fill #0

## 2018-08-25 MED FILL — TOPIRAMATE 25 MG TAB: 25 | 30 days supply | Qty: 60 | Fill #0

## 2018-10-06 MED FILL — TOPIRAMATE 25 MG TAB: 25 | 30 days supply | Qty: 60 | Fill #1

## 2018-11-13 ENCOUNTER — Other Ambulatory Visit: Payer: Self-pay | Admitting: Allergy

## 2018-11-13 DIAGNOSIS — R112 Nausea with vomiting, unspecified: Secondary | ICD-10-CM | POA: Diagnosis not present

## 2018-11-13 DIAGNOSIS — J3489 Other specified disorders of nose and nasal sinuses: Secondary | ICD-10-CM | POA: Diagnosis not present

## 2018-11-13 DIAGNOSIS — J029 Acute pharyngitis, unspecified: Secondary | ICD-10-CM | POA: Diagnosis not present

## 2018-11-13 MED FILL — TOPIRAMATE 25 MG TABLET: 25 | 30 days supply | Qty: 30 | Fill #0

## 2018-11-13 MED FILL — AZELASTINE HCL 137 MCG SPRY: 0.1 | 30 days supply | Qty: 30 | Fill #0

## 2018-11-13 NOTE — Progress Notes (Signed)
Has had episode of vomiting several days ago, headaches, sore throat, rhinorrhea.  No fevers. Taking mucinex, xhance and antihistamine at this time.  Works in [employer].  On exam with inflammed posterior oropharynx with cobblestoning, dull TM without erythema or bulging.  Will obtain covid screen.   Recommend for rhinorrhea with PND to use either astelin or nasal atrovent.  She will notify her PCP.

## 2018-11-15 LAB — NOVEL CORONAVIRUS, NAA: SARS-CoV-2, NAA: NOT DETECTED

## 2018-11-26 MED FILL — TOPIRAMATE 25 MG TAB: 25 | 90 days supply | Qty: 90 | Fill #0

## 2018-11-26 MED FILL — busPIRone HCL 15 MG TABS: 15 | 90 days supply | Qty: 180 | Fill #0

## 2019-01-01 DIAGNOSIS — Z0000 Encounter for general adult medical examination without abnormal findings: Secondary | ICD-10-CM | POA: Diagnosis not present

## 2019-02-10 ENCOUNTER — Encounter: Payer: Self-pay | Admitting: Allergy

## 2019-02-10 ENCOUNTER — Other Ambulatory Visit: Payer: Self-pay

## 2019-02-10 ENCOUNTER — Ambulatory Visit (INDEPENDENT_AMBULATORY_CARE_PROVIDER_SITE_OTHER): Payer: 59 | Admitting: Allergy

## 2019-02-10 VITALS — BP 112/78 | HR 84 | Temp 98.0°F | Resp 18 | Ht 66.5 in | Wt 200.2 lb

## 2019-02-10 DIAGNOSIS — Z8742 Personal history of other diseases of the female genital tract: Secondary | ICD-10-CM | POA: Diagnosis not present

## 2019-02-10 DIAGNOSIS — K219 Gastro-esophageal reflux disease without esophagitis: Secondary | ICD-10-CM | POA: Diagnosis not present

## 2019-02-10 DIAGNOSIS — F430 Acute stress reaction: Secondary | ICD-10-CM | POA: Diagnosis not present

## 2019-02-10 DIAGNOSIS — T7840XA Allergy, unspecified, initial encounter: Secondary | ICD-10-CM | POA: Diagnosis not present

## 2019-02-10 DIAGNOSIS — Z0000 Encounter for general adult medical examination without abnormal findings: Secondary | ICD-10-CM | POA: Diagnosis not present

## 2019-02-10 DIAGNOSIS — J3089 Other allergic rhinitis: Secondary | ICD-10-CM

## 2019-02-10 DIAGNOSIS — G43109 Migraine with aura, not intractable, without status migrainosus: Secondary | ICD-10-CM | POA: Diagnosis not present

## 2019-02-10 DIAGNOSIS — H1013 Acute atopic conjunctivitis, bilateral: Secondary | ICD-10-CM | POA: Diagnosis not present

## 2019-02-10 DIAGNOSIS — B009 Herpesviral infection, unspecified: Secondary | ICD-10-CM | POA: Diagnosis not present

## 2019-02-10 DIAGNOSIS — D061 Carcinoma in situ of exocervix: Secondary | ICD-10-CM | POA: Diagnosis not present

## 2019-02-10 DIAGNOSIS — Z1151 Encounter for screening for human papillomavirus (HPV): Secondary | ICD-10-CM | POA: Diagnosis not present

## 2019-02-10 DIAGNOSIS — Z124 Encounter for screening for malignant neoplasm of cervix: Secondary | ICD-10-CM | POA: Diagnosis not present

## 2019-02-10 MED ORDER — OLOPATADINE HCL 0.1 % OP SOLN: 1.0000 [drp] | Freq: Two times a day (BID) | OPHTHALMIC | 5 refills | Status: AC

## 2019-02-10 MED FILL — OLOPATADINE HCL 0.1% EYE DR: 0.1 | 25 days supply | Qty: 5 | Fill #0

## 2019-02-10 NOTE — Progress Notes (Signed)
New Patient Note  RE: Brittney Black MRN: [id number] DOB: Jul 27, 1985 Date of Office Visit: 02/10/2019  Referring provider: Roderic Ovens Christin* Primary care provider: Lezlie Octave, PA-C  Chief Complaint: allergic reactions  History of present illness: Brittney Black is a 33 y.o. female presenting today for consultation for allergic reactions.  She has noticed symptoms of itchy and burning eyes and then her forehead and rest of her face follows with burning and redness. She does note some lip and throat itching and some red blotchiness of her face.   She has notieded some coughing, runny nose as well with the facial symptoms.  She denies respiratory distress.   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   They have tried changing the cotton balls used in the injection room without any change in symptoms.  She uses latex free gloves.      She will wipe her face off typically with alcohol and will take antihistamine and pepcid which does help after about several hours.  She has tried taking 2 allegra tabs the night before she is to work in the injection room however reaction still occurred.    She denies any change in make-up or facial or hair products.  No change in detergents/soaps/lotions.   She reports beginning of spring and fall and sometimes in the summer will develop stuffy nose, occasional runny nose, sneezing.   Using allegra which helps.  Uses Xhance and astelin as needed for congestion and drainage respectively, both of which are effective.    No history asthma, eczema or food allergy.    Review of systems: Review of Systems  Constitutional: Negative for chills, fever and malaise/fatigue.  HENT: Negative for congestion, ear discharge,  nosebleeds, sinus pain and sore throat.   Eyes: Negative for pain, discharge and redness.  Respiratory: Positive for cough. Negative for hemoptysis, sputum production, shortness of breath and wheezing.   Cardiovascular: Negative.   Gastrointestinal: Negative.   Musculoskeletal: Negative.   Skin: Positive for itching and rash.  Neurological: Positive for tingling. Negative for dizziness and headaches.    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    Past surgical history: Past Surgical History:  Procedure Laterality Date   CHOLECYSTECTOMY N/A 07/16/2018   Procedure: LAPAROSCOPIC CHOLECYSTECTOMY;  Surgeon: Erroll Luna, MD;  Location: [facility];  Service: General;  Laterality: N/A;    Family history:  Family History  Problem Relation Age of Onset   Cervical cancer Mother    Hypertension Mother    Healthy Father    Hernia Father    Heart disease Father    Skin cancer Father    Heart murmur Sister    Heart disease Sister    Bipolar disorder Sister    Anxiety disorder Sister    Asthma Brother    Depression Brother    Cervical cancer Maternal Grandmother    Stroke Maternal Grandmother    Heart attack  Maternal Grandmother    Hypercholesterolemia Maternal Grandmother    COPD Maternal Grandmother    Kidney failure Maternal Grandmother    Hypertension Maternal Grandmother    Brain cancer Paternal Grandfather    Pneumonia Paternal Grandfather     Social history: Lives in a home with carpeting with electric heating and central cooling.  Cats and dogs in the home.  No concern for water damage, mildew or roaches in the home.  She is a CMA at [employer].  Former smoker quite 01/2018 from 1/2 pack for 15 years.       Medication List: Current Outpatient Medications  Medication Sig Dispense Refill   azelastine (ASTELIN) 0.1 % nasal spray Place into the nose.     busPIRone (BUSPAR) 15 MG tablet Take by mouth.     clindamycin (CLINDAGEL) 1 % gel Apply 1 application topically 2 (two) times daily.     famotidine (PEPCID) 40 MG tablet Take by mouth.     fexofenadine (ALLEGRA) 180 MG tablet      Fluticasone Propionate (XHANCE) 93 MCG/ACT EXHU Place into the nose.     ibuprofen (ADVIL,MOTRIN) 600 MG tablet Take by mouth.     levonorgestrel (MIRENA) 20 MCG/24HR IUD by Intrauterine route.     rizatriptan (MAXALT-MLT) 10 MG disintegrating tablet      topiramate (TOPAMAX) 25 MG tablet      valACYclovir (VALTREX) 1000 MG tablet Take by mouth.     olopatadine (PATANOL) 0.1 % ophthalmic solution Place 1 drop into both eyes 2 (two) times daily. 5 mL 5   No current facility-administered medications for this visit.     Known medication allergies: Allergies  Allergen Reactions   Latex Hives and Rash   Lorazepam Anaphylaxis   Naproxen Anaphylaxis, Hives and Nausea And Vomiting   Naproxen Sodium Anaphylaxis   Acetaminophen Nausea And Vomiting   Niacin     Redness, hotflashes   Niacin And Related Rash     Physical examination: Blood pressure 112/78, pulse 84, temperature 98 F (36.7 C), temperature source Temporal, resp. rate 18, height 5' 6.5" (1.689 m), weight 200 lb 3.2 oz (90.8 kg), SpO2 97 %.  General: Alert, interactive, in no acute distress. HEENT: PERRLA, TMs pearly gray, turbinates mildly edematous without discharge, post-pharynx non erythematous. Neck: Supple without lymphadenopathy. Lungs: Clear to auscultation without wheezing, rhonchi or rales. {no increased work of breathing. CV: Normal S1, S2 without murmurs. Abdomen: Nondistended, nontender. Skin: Warm and dry, without lesions or rashes. Extremities:  No clubbing, cyanosis or edema. Neuro:   Grossly  intact.  Diagnositics/Labs: Allergy testing: environmental allergy skin prick testing shows slight reactivity to rough pigweed, hickory.   Intradermal testing is positive to grass mix, mold mix 1,3,4.  Select food allergy skin testing is negative.   Allergy testing results were read and interpreted by provider, documented by clinical staff.   Assessment and plan: Patient Instructions  Allergic reaction Allergic rhinitis with conjunctivitis  - symptoms including facial itching and redness, itchy/burning eyes, mouth/oral itching, nasal drainage and cough appear to be triggered by environmental exposures while working  - environmental allergy skin testing shows reactivity to weed pollen, tree pollen, grass pollen, molds  - select food allergy skin testing is negative  - will plan to provide deep cleaning of workplace environment to help decrease risk of symptoms  - would recommend taking Allegra 180mg  with Pepcid 20mg  approximately 1-2 hours prior to usual onset of symptoms   - will prescribe Patanol eye  drop 1 drop each eye daily as needed for itchy/watery/red eyes  - continue use of Xhance nasal spray 2 sprays each nostril 1-2 times a day as needed for nasal congestion  - continue Astelin 2 sprays each nostril twice a day as needed for nasal drainage  - consider additional Singulair if antihistamine coverage is not enough to prevent/manage symptoms  - discussed option of patch testing however do not feel symptoms are being triggered by contact dermatitis/allergy  - consider course of allergen immunotherapy if medication management is not effective enough in controlling symptoms.  Follow-up 4-6 months or sooner if needed  I appreciate the opportunity to take part in [place] care. Please do not hesitate to contact me with questions.  Sincerely,   Prudy Feeler, MD Allergy/Immunology [facility]

## 2019-02-10 NOTE — Patient Instructions (Addendum)
Allergic reaction Allergic rhinitis with conjunctivitis  - symptoms including facial itching and redness, itchy/burning eyes, mouth/oral itching, nasal drainage and cough appear to be triggered by environmental exposures while working  - environmental allergy skin testing shows reactivity to weed pollen, tree pollen, grass pollen, molds  - select food allergy skin testing is negative  - will plan to provide deep cleaning of workplace environment to help decrease risk of symptoms  - would recommend taking Allegra 180mg  with Pepcid 20mg  approximately 1-2 hours prior to usual onset of symptoms   - will prescribe Patanol eye drop 1 drop each eye daily as needed for itchy/watery/red eyes  - continue use of Xhance nasal spray 2 sprays each nostril 1-2 times a day as needed for nasal congestion  - continue Astelin 2 sprays each nostril twice a day as needed for nasal drainage  - consider additional Singulair if antihistamine coverage is not enough to prevent/manage symptoms  - discussed option of patch testing however do not feel symptoms are being triggered by contact dermatitis/allergy  - consider course of allergen immunotherapy if medication management is not effective enough in controlling symptoms.  Follow-up 4-6 months or sooner if needed

## 2019-03-18 MED FILL — TOPIRAMATE 25 MG TAB: 25 | 90 days supply | Qty: 90 | Fill #1

## 2019-03-24 DIAGNOSIS — R102 Pelvic and perineal pain: Secondary | ICD-10-CM | POA: Diagnosis not present

## 2019-03-24 DIAGNOSIS — M5441 Lumbago with sciatica, right side: Secondary | ICD-10-CM | POA: Diagnosis not present

## 2019-03-25 DIAGNOSIS — M5441 Lumbago with sciatica, right side: Secondary | ICD-10-CM | POA: Diagnosis not present

## 2019-03-25 DIAGNOSIS — R1030 Lower abdominal pain, unspecified: Secondary | ICD-10-CM | POA: Diagnosis not present

## 2019-03-25 DIAGNOSIS — R102 Pelvic and perineal pain: Secondary | ICD-10-CM | POA: Diagnosis not present

## 2019-03-25 MED FILL — predniSONE 10 MG TABS: 10 | 6 days supply | Qty: 21 | Fill #0

## 2019-04-13 MED FILL — busPIRone HCL 15 MG TABS: 15 | 90 days supply | Qty: 180 | Fill #1

## 2019-05-26 MED FILL — miSOPROStol 200 MCG TABS: 200 | 1 days supply | Qty: 4 | Fill #0

## 2019-06-02 DIAGNOSIS — Z30433 Encounter for removal and reinsertion of intrauterine contraceptive device: Secondary | ICD-10-CM | POA: Diagnosis not present

## 2019-06-17 MED FILL — TOPIRAMATE 25 MG TAB: 25 | 90 days supply | Qty: 90 | Fill #2

## 2019-08-20 DIAGNOSIS — H5212 Myopia, left eye: Secondary | ICD-10-CM | POA: Diagnosis not present

## 2019-09-21 MED FILL — TOPIRAMATE 25 MG TAB: 25 | 90 days supply | Qty: 90 | Fill #3

## 2019-10-05 ENCOUNTER — Ambulatory Visit (INDEPENDENT_AMBULATORY_CARE_PROVIDER_SITE_OTHER): Payer: Worker's Compensation

## 2019-10-05 ENCOUNTER — Ambulatory Visit (HOSPITAL_COMMUNITY)
Admission: EM | Admit: 2019-10-05 | Discharge: 2019-10-05 | Disposition: A | Discharge status: Home / Self Care | Payer: Worker's Compensation | Attending: Family Medicine | Admitting: Family Medicine

## 2019-10-05 ENCOUNTER — Other Ambulatory Visit: Payer: Self-pay

## 2019-10-05 ENCOUNTER — Encounter (HOSPITAL_COMMUNITY): Payer: Self-pay

## 2019-10-05 DIAGNOSIS — M79642 Pain in left hand: Secondary | ICD-10-CM

## 2019-10-05 DIAGNOSIS — S6992XA Unspecified injury of left wrist, hand and finger(s), initial encounter: Secondary | ICD-10-CM

## 2019-10-05 DIAGNOSIS — M25532 Pain in left wrist: Secondary | ICD-10-CM | POA: Diagnosis not present

## 2019-10-05 NOTE — ED Triage Notes (Signed)
Pt presents with left hand injury after hand slammed against the handle of vaccination refrigerator while at work this afternoon.

## 2019-10-05 NOTE — Discharge Instructions (Signed)
Please try ice  Please try ibuprofen for pain  Please use the splint  Please follow up in one week.

## 2019-10-05 NOTE — ED Provider Notes (Signed)
[facility]    CSN: [id number] Arrival date & time: 10/05/19  1722      History   Chief Complaint Chief Complaint  Patient presents with  . Hand Injury    HPI Brittney Black is a 34 y.o. female.   She is presenting with left hand pain and wrist pain.  She was at work and hit her hand on the refrigerator.  Now she is having pain over the third MCP joint.  Has some pain over the dorsal carpal bones as well.  Swelling at the third MCP joint.  Pain with flexion.  No numbness or tingling  HPI  Past Medical History:  Diagnosis Date  . Anemia    history of anemia 10 years ago, due to septic uti  . Anxiety    anxiety  . 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 Frequent bowel movements 01/31/2016  . Tobacco dependency 01/31/2016  . Compound nevus of skin 11/10/2015  . Acute PID (pelvic inflammatory disease) 10/25/2015  . Changing pigmented skin lesion 10/06/2015  . Overweight (BMI 25.0-29.9) 05/31/2015  . 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 Herpesviral infection, unspecified 12/29/2014  . Tobacco use 12/29/2014    Past Surgical History:  Procedure Laterality Date  . CHOLECYSTECTOMY N/A 07/16/2018   Procedure: LAPAROSCOPIC CHOLECYSTECTOMY;  Surgeon: Erroll Luna, MD;  Location: [facility];  Service: General;  Laterality: N/A;    OB History   No obstetric history on file.      Home Medications    Prior to Admission medications   Medication Sig Start Date End Date Taking? Authorizing Provider  busPIRone (BUSPAR) 15 MG tablet Take by mouth. 01/30/18   [provider]  clindamycin (CLINDAGEL) 1 % gel Apply 1 application topically 2 (two) times daily.    [provider]  fexofenadine (ALLEGRA) 180 MG tablet  07/03/16   [provider]  ibuprofen (ADVIL,MOTRIN) 600 MG tablet Take by mouth.    [provider]  levonorgestrel  (MIRENA) 20 MCG/24HR IUD by Intrauterine route. 03/03/15   [provider]  olopatadine (PATANOL) 0.1 % ophthalmic solution Place 1 drop into both eyes 2 (two) times daily. 02/10/19   Kennith Gain, MD  rizatriptan (MAXALT-MLT) 10 MG disintegrating tablet  08/25/18   [provider]  topiramate (TOPAMAX) 25 MG tablet  10/06/18   [provider]  azelastine (ASTELIN) 0.1 % nasal spray Place into the nose. 11/13/18 10/05/19  [provider]  famotidine (PEPCID) 40 MG tablet Take by mouth.  10/05/19  [provider]  Fluticasone Propionate (XHANCE) 93 MCG/ACT [place] [facility] into the nose. 08/06/17 10/05/19  [provider]    Family History Family History  Problem Relation Age of Onset  . Cervical cancer Mother   . Hypertension Mother   . Healthy Father   . Hernia Father   . Heart disease Father   . Skin cancer Father   . Heart murmur Sister   . Heart disease Sister   . Bipolar disorder Sister   . Anxiety disorder Sister   . Asthma Brother   . Depression Brother   . Cervical cancer Maternal Grandmother   . Stroke Maternal Grandmother   . Heart attack Maternal Grandmother   . Hypercholesterolemia Maternal Grandmother   . COPD Maternal Grandmother   . Kidney failure Maternal Grandmother   .  Hypertension Maternal Grandmother   . Brain cancer Paternal Grandfather   . Pneumonia Paternal Grandfather     Social History Social History   Tobacco Use  . Smoking status: Former Smoker    Quit date: 01/31/2018    Years since quitting: 1.6  . Smokeless tobacco: Never Used  Vaping Use  . Vaping Use: Some days  Substance Use Topics  . Alcohol use: Yes    Comment: liquor and wine socially  . Drug use: Never     Allergies   Latex, Lorazepam, Naproxen, Naproxen sodium, Acetaminophen, Niacin, and Niacin and related   Review of Systems Review of Systems  See HPI  Physical Exam Triage Vital Signs ED Triage Vitals  Enc Vitals Group       BP 10/05/19 1752 117/71     Pulse Rate 10/05/19 1752 85     Resp 10/05/19 1752 17     Temp 10/05/19 1752 98.3 F (36.8 C)     Temp Source 10/05/19 1752 Oral     SpO2 10/05/19 1752 99 %     Weight --      Height --      Head Circumference --      Peak Flow --      Pain Score 10/05/19 1750 8     Pain Loc --      Pain Edu? --      Excl. in [place]? --    No data found.  Updated Vital Signs BP 117/71 (BP Location: Right Arm)   Pulse 85   Temp 98.3 F (36.8 C) (Oral)   Resp 17   SpO2 99%   Visual Acuity Right Eye Distance:   Left Eye Distance:   Bilateral Distance:    Right Eye Near:   Left Eye Near:    Bilateral Near:     Physical Exam Gen: NAD, alert, cooperative with exam, well-appearing ENT: normal lips, normal nasal mucosa,  Eye: normal EOM, normal conjunctiva and lids Psych:  normal insight, alert and oriented MSK:  Left hand: Swelling over the third MCP joint. Tenderness palpation of the distal radius and carpal bones. Limited flexion extension of the wrist. No malrotation or misalignment. Neurovascularly intact   UC Treatments / Results  Labs (all labs ordered are listed, but only abnormal results are displayed) Labs Reviewed - No data to display  EKG   Radiology DG Wrist Complete Left  Result Date: 10/05/2019 CLINICAL DATA:  Hit hand on door EXAM: LEFT WRIST - COMPLETE 3+ VIEW COMPARISON:  None. FINDINGS: There is no evidence of fracture or dislocation. There is no evidence of arthropathy or other focal bone abnormality. Soft tissues are unremarkable. IMPRESSION: Negative. Electronically Signed   By: Donavan Foil M.D.   On: 10/05/2019 19:18   DG Hand Complete Left  Result Date: 10/05/2019 CLINICAL DATA:  Work injury Merchandiser, retail EXAM: LEFT HAND - COMPLETE 3+ VIEW COMPARISON:  None. FINDINGS: There is no evidence of fracture or dislocation. There is no evidence of arthropathy or other focal bone abnormality. Soft tissues are unremarkable.  IMPRESSION: Negative. Electronically Signed   By: Donavan Foil M.D.   On: 10/05/2019 19:18    Procedures Procedures (including critical care time)  Medications Ordered in UC Medications - No data to display  Initial Impression / Assessment and Plan / UC Course  I have reviewed the triage vital signs and the nursing notes.  Pertinent labs & imaging results that were available during my care of the patient  were reviewed by me and considered in my medical decision making (see chart for details).     Ms. Yingst is a 34 year old female that is presenting with left hand pain following an injury that she sustained at work.  Imaging was negative for fracture.  Concern for capsular irritation with trauma.  Provided splint.  Counseled on supportive care.  Advised when to follow-up.  Final Clinical Impressions(s) / UC Diagnoses   Final diagnoses:  Left hand pain  Left wrist pain     Discharge Instructions     Please try ice  Please try ibuprofen for pain  Please use the splint  Please follow up in one week.     ED Prescriptions    None     PDMP not reviewed this encounter.   Rosemarie Ax, MD 10/05/19 2020

## 2019-10-12 ENCOUNTER — Ambulatory Visit: Payer: Self-pay

## 2019-10-12 ENCOUNTER — Encounter: Payer: Self-pay | Admitting: Family Medicine

## 2019-10-12 ENCOUNTER — Ambulatory Visit (INDEPENDENT_AMBULATORY_CARE_PROVIDER_SITE_OTHER): Payer: Worker's Compensation | Admitting: Family Medicine

## 2019-10-12 ENCOUNTER — Other Ambulatory Visit: Payer: Self-pay

## 2019-10-12 VITALS — BP 128/81 | HR 92 | Ht 68.0 in | Wt 193.0 lb

## 2019-10-12 DIAGNOSIS — M79642 Pain in left hand: Secondary | ICD-10-CM

## 2019-10-12 NOTE — Progress Notes (Signed)
Brittney Black - 34 y.o. female MRN [id number]  Date of birth: 07/28/1985  SUBJECTIVE:  Including CC & ROS.  Chief Complaint  Patient presents with  . Hand Injury    left x 10/05/2019    Brittney Black is a 34 y.o. female that is presenting with left hand pain.  The injury occurred while at work.  She hit her hand against a refrigerator door.  Was seen in urgent care and placed in a splint.  Since that time her pain is improved.  Denies any numbness or tingling..  Independent review of the left wrist and hand x-ray from 7/6 with no acute abnormality.   Review of Systems See HPI   HISTORY: Past Medical, Surgical, Social, and Family History Reviewed & Updated per EMR.   Pertinent Historical Findings include:  Past Medical History:  Diagnosis Date  . Anemia    history of anemia 10 years ago, due to septic uti  . Anxiety    anxiety  . 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 CHOLECYSTECTOMY N/A 07/16/2018   Procedure: LAPAROSCOPIC CHOLECYSTECTOMY;  Surgeon: Erroll Luna, MD;  Location: [facility];  Service: General;  Laterality: N/A;    Family History  Problem Relation Age of Onset  . Cervical cancer Mother   . Hypertension Mother   . Healthy Father   . Hernia Father   . Heart disease Father   . Skin cancer Father   . Heart murmur Sister   . Heart disease Sister   . Bipolar disorder Sister   . Anxiety disorder Sister   . Asthma Brother   . Depression Brother   . Cervical cancer Maternal Grandmother   . Stroke Maternal Grandmother   . Heart attack Maternal Grandmother   . Hypercholesterolemia Maternal Grandmother   . COPD Maternal Grandmother   . Kidney failure Maternal Grandmother   . Hypertension Maternal Grandmother   . Brain cancer Paternal Grandfather   . Pneumonia Paternal Grandfather     Social History   Socioeconomic History  . Marital status: Married    Spouse name:  Not on file  . Number of children: Not on file  . Years of education: Not on file  . Highest education level: Not on file  Occupational History  . Not on file  Tobacco Use  . Smoking status: Former Smoker    Quit date: 01/31/2018    Years since quitting: 1.6  . Smokeless tobacco: Never Used  Vaping Use  . Vaping Use: Some days  Substance and Sexual Activity  . Alcohol use: Yes    Comment: liquor and wine socially  . Drug use: Never  . Sexual activity: Not on file  Other Topics Concern  . Not on file  Social History Narrative   Lives with husband, and son, dog and Neurosurgeon   Social Determinants of Health   Financial Resource Strain:   . Difficulty of Paying Living Expenses:   Food Insecurity:   . Worried About Charity fundraiser in the Last Year:   . Arboriculturist in the Last Year:   Transportation Needs:   . Film/video editor (Medical):   Marland Kitchen Lack of Transportation (Non-Medical):   Physical Activity:   . Days of Exercise per Week:   . Minutes of Exercise per Session:   Stress:   . Feeling of Stress :  Social Connections:   . Frequency of Communication with Friends and Family:   . Frequency of Social Gatherings with Friends and Family:   . Attends Religious Services:   . Active Member of Clubs or Organizations:   . Attends Archivist Meetings:   Marland Kitchen Marital Status:   Intimate Partner Violence:   . Fear of Current or Ex-Partner:   . Emotionally Abused:   Marland Kitchen Physically Abused:   . Sexually Abused:      PHYSICAL EXAM:  VS: BP 128/81   Pulse 92   Ht 5\' 8"  (1.727 m)   Wt 193 lb (87.5 kg)   BMI 29.35 kg/m  Physical Exam Gen: NAD, alert, cooperative with exam, well-appearing MSK:  Left hand: No ecchymosis or swelling. No tenderness to palpation over the third and fourth metacarpal shafts. No tenderness palpation of the third or fourth joints. Normal finger flexion extension. No malrotation or misalignment. Neurovascularly intact  Limited  ultrasound: Left hand:  Normal-appearing third metacarpal shaft, the third MCP joint and proximal phalanges. No changes in the interdigit webspace. Normal-appearing fourth metacarpal shaft, fourth MCP joint and proximal phalanges.  Summary: No structural changes appreciated.  Ultrasound and interpretation by Clearance Coots, MD    ASSESSMENT & PLAN:   Left hand pain Injury occurred on 7/6.  While at work.  Splinted and has done well with no significant pain today.  She does have some stiffness in the joints. -Counseled on home exercise therapy and supportive care. -Counseled on buddy taping. -Provided work note. -Can follow-up as needed.

## 2019-10-12 NOTE — Assessment & Plan Note (Signed)
Injury occurred on 7/6.  While at work.  Splinted and has done well with no significant pain today.  She does have some stiffness in the joints. -Counseled on home exercise therapy and supportive care. -Counseled on buddy taping. -Provided work note. -Can follow-up as needed.

## 2019-10-12 NOTE — Patient Instructions (Signed)
Good to see you Please try buddy taping for 2-3 weeks  Please try ice  Please try ibuprofen as needed   Please send me a message in MyChart with any questions or updates.  Please see [place] back as needed.   --Dr. Raeford Razor

## 2019-10-13 ENCOUNTER — Encounter: Payer: Self-pay | Admitting: Family Medicine

## 2019-11-23 ENCOUNTER — Telehealth: Payer: Self-pay | Admitting: *Deleted

## 2019-11-23 DIAGNOSIS — R112 Nausea with vomiting, unspecified: Secondary | ICD-10-CM | POA: Diagnosis not present

## 2019-11-23 DIAGNOSIS — R238 Other skin changes: Secondary | ICD-10-CM

## 2019-11-23 DIAGNOSIS — R1011 Right upper quadrant pain: Secondary | ICD-10-CM | POA: Diagnosis not present

## 2019-11-23 DIAGNOSIS — R233 Spontaneous ecchymoses: Secondary | ICD-10-CM

## 2019-11-23 DIAGNOSIS — R5383 Other fatigue: Secondary | ICD-10-CM | POA: Diagnosis not present

## 2019-11-23 MED FILL — OMEPRAZOLE DR 20 MG CAPSULE: 20 | 30 days supply | Qty: 30 | Fill #0

## 2019-11-23 MED FILL — ONDANSETRON ODT 4 MG TABLET: 4 | 7 days supply | Qty: 20 | Fill #0

## 2019-11-23 NOTE — Telephone Encounter (Signed)
Lab orders per Dr. Ernst Bowler.

## 2019-11-24 LAB — LIPASE: Lipase: 25 U/L (ref 14–72)

## 2019-11-24 LAB — PT AND PTT
INR: 0.9 (ref 0.9–1.2)
Prothrombin Time: 9.9 s (ref 9.1–12.0)
aPTT: 26 s (ref 24–33)

## 2019-11-24 LAB — CBC WITH DIFFERENTIAL
Basophils Absolute: 0.1 10*3/uL (ref 0.0–0.2)
Basos: 0 %
EOS (ABSOLUTE): 0 10*3/uL (ref 0.0–0.4)
Eos: 0 %
Hematocrit: 40.9 % (ref 34.0–46.6)
Hemoglobin: 13.3 g/dL (ref 11.1–15.9)
Immature Grans (Abs): 0 10*3/uL (ref 0.0–0.1)
Immature Granulocytes: 0 %
Lymphocytes Absolute: 2.8 10*3/uL (ref 0.7–3.1)
Lymphs: 25 %
MCH: 29.1 pg (ref 26.6–33.0)
MCHC: 32.5 g/dL (ref 31.5–35.7)
MCV: 90 fL (ref 79–97)
Monocytes Absolute: 0.6 10*3/uL (ref 0.1–0.9)
Monocytes: 6 %
Neutrophils Absolute: 7.7 10*3/uL — ABNORMAL HIGH (ref 1.4–7.0)
Neutrophils: 69 %
RBC: 4.57 x10E6/uL (ref 3.77–5.28)
RDW: 12.3 % (ref 11.7–15.4)
WBC: 11.2 10*3/uL — ABNORMAL HIGH (ref 3.4–10.8)

## 2019-11-24 LAB — COMPREHENSIVE METABOLIC PANEL
ALT: 12 IU/L (ref 0–32)
AST: 18 IU/L (ref 0–40)
Albumin/Globulin Ratio: 2 (ref 1.2–2.2)
Albumin: 4.6 g/dL (ref 3.8–4.8)
Alkaline Phosphatase: 84 IU/L (ref 48–121)
BUN/Creatinine Ratio: 14 (ref 9–23)
BUN: 13 mg/dL (ref 6–20)
Bilirubin Total: 0.3 mg/dL (ref 0.0–1.2)
CO2: 22 mmol/L (ref 20–29)
Calcium: 9.3 mg/dL (ref 8.7–10.2)
Chloride: 106 mmol/L (ref 96–106)
Creatinine, Ser: 0.92 mg/dL (ref 0.57–1.00)
GFR calc Af Amer: 95 mL/min/{1.73_m2} (ref >59)
GFR calc non Af Amer: 83 mL/min/{1.73_m2} (ref >59)
Globulin, Total: 2.3 g/dL (ref 1.5–4.5)
Glucose: 108 mg/dL — ABNORMAL HIGH (ref 65–99)
Potassium: 3.9 mmol/L (ref 3.5–5.2)
Sodium: 140 mmol/L (ref 134–144)
Total Protein: 6.9 g/dL (ref 6.0–8.5)

## 2019-11-24 LAB — IRON: Iron: 55 ug/dL (ref 27–159)

## 2019-12-24 ENCOUNTER — Other Ambulatory Visit (HOSPITAL_COMMUNITY): Payer: Self-pay | Admitting: Family Medicine

## 2019-12-24 DIAGNOSIS — D72829 Elevated white blood cell count, unspecified: Secondary | ICD-10-CM | POA: Diagnosis not present

## 2019-12-24 DIAGNOSIS — R238 Other skin changes: Secondary | ICD-10-CM | POA: Diagnosis not present

## 2019-12-24 DIAGNOSIS — R5383 Other fatigue: Secondary | ICD-10-CM | POA: Diagnosis not present

## 2019-12-24 DIAGNOSIS — R1011 Right upper quadrant pain: Secondary | ICD-10-CM | POA: Diagnosis not present

## 2019-12-24 DIAGNOSIS — R112 Nausea with vomiting, unspecified: Secondary | ICD-10-CM | POA: Diagnosis not present

## 2019-12-24 MED FILL — PANTOPRAZOLE SOD DR 20 MG T: 20 | 45 days supply | Qty: 90 | Fill #0

## 2019-12-24 MED FILL — TOPIRAMATE 25 MG TAB: 25 | 90 days supply | Qty: 90 | Fill #0

## 2020-01-31 ENCOUNTER — Other Ambulatory Visit (HOSPITAL_COMMUNITY): Payer: Self-pay | Admitting: Family Medicine

## 2020-01-31 MED FILL — busPIRone HCL 15 MG TABS: 15 | 90 days supply | Qty: 180 | Fill #0

## 2020-02-08 DIAGNOSIS — R059 Cough, unspecified: Secondary | ICD-10-CM | POA: Diagnosis not present

## 2020-02-08 DIAGNOSIS — J069 Acute upper respiratory infection, unspecified: Secondary | ICD-10-CM | POA: Diagnosis not present

## 2020-03-03 DIAGNOSIS — E669 Obesity, unspecified: Secondary | ICD-10-CM | POA: Diagnosis not present

## 2020-03-03 DIAGNOSIS — F430 Acute stress reaction: Secondary | ICD-10-CM | POA: Diagnosis not present

## 2020-03-03 DIAGNOSIS — K2100 Gastro-esophageal reflux disease with esophagitis, without bleeding: Secondary | ICD-10-CM | POA: Diagnosis not present

## 2020-03-03 DIAGNOSIS — B009 Herpesviral infection, unspecified: Secondary | ICD-10-CM | POA: Diagnosis not present

## 2020-03-03 DIAGNOSIS — Z6830 Body mass index (BMI) 30.0-30.9, adult: Secondary | ICD-10-CM | POA: Diagnosis not present

## 2020-03-03 DIAGNOSIS — Z0000 Encounter for general adult medical examination without abnormal findings: Secondary | ICD-10-CM | POA: Diagnosis not present

## 2020-03-03 DIAGNOSIS — Z975 Presence of (intrauterine) contraceptive device: Secondary | ICD-10-CM | POA: Diagnosis not present

## 2020-03-03 DIAGNOSIS — G43109 Migraine with aura, not intractable, without status migrainosus: Secondary | ICD-10-CM | POA: Diagnosis not present

## 2020-03-03 DIAGNOSIS — Z79899 Other long term (current) drug therapy: Secondary | ICD-10-CM | POA: Diagnosis not present

## 2020-03-07 ENCOUNTER — Other Ambulatory Visit: Payer: Self-pay

## 2020-03-07 DIAGNOSIS — T7840XA Allergy, unspecified, initial encounter: Secondary | ICD-10-CM

## 2020-03-07 MED ORDER — EPINEPHRINE (ANAPHYLAXIS) 1 MG/ML IJ SOLN
0.3000 mg | Freq: Once | INTRAMUSCULAR | Status: AC
  Administered 2020-03-07: 0.3 mg via INTRAMUSCULAR

## 2020-03-07 MED ORDER — EPINEPHRINE PF 1 MG/ML IJ SOLN: 0.3000 mg | Freq: Once | INTRAMUSCULAR | Status: DC

## 2020-03-10 ENCOUNTER — Other Ambulatory Visit: Payer: Self-pay | Admitting: *Deleted

## 2020-03-10 DIAGNOSIS — Z0000 Encounter for general adult medical examination without abnormal findings: Secondary | ICD-10-CM | POA: Diagnosis not present

## 2020-03-10 DIAGNOSIS — G43109 Migraine with aura, not intractable, without status migrainosus: Secondary | ICD-10-CM | POA: Diagnosis not present

## 2020-03-10 DIAGNOSIS — B009 Herpesviral infection, unspecified: Secondary | ICD-10-CM | POA: Diagnosis not present

## 2020-03-10 DIAGNOSIS — Z1151 Encounter for screening for human papillomavirus (HPV): Secondary | ICD-10-CM | POA: Diagnosis not present

## 2020-03-10 DIAGNOSIS — D061 Carcinoma in situ of exocervix: Secondary | ICD-10-CM | POA: Diagnosis not present

## 2020-03-10 DIAGNOSIS — F430 Acute stress reaction: Secondary | ICD-10-CM | POA: Diagnosis not present

## 2020-03-10 DIAGNOSIS — E669 Obesity, unspecified: Secondary | ICD-10-CM | POA: Diagnosis not present

## 2020-03-10 DIAGNOSIS — Z124 Encounter for screening for malignant neoplasm of cervix: Secondary | ICD-10-CM | POA: Diagnosis not present

## 2020-03-10 DIAGNOSIS — K2100 Gastro-esophageal reflux disease with esophagitis, without bleeding: Secondary | ICD-10-CM | POA: Diagnosis not present

## 2020-03-10 MED ORDER — EPINEPHRINE 0.3 MG/0.3ML IJ SOAJ: 0.3000 mg | Freq: Once | INTRAMUSCULAR | 2 refills | Status: DC

## 2020-03-10 MED FILL — EPINEPHRINE 0.3 MG AUTO-INJ: 0.3 | 2 days supply | Qty: 2 | Fill #0

## 2020-03-30 MED FILL — TOPIRAMATE 25 MG TAB: 25 | 90 days supply | Qty: 90 | Fill #1

## 2020-04-07 MED FILL — PANTOPRAZOLE SOD DR 20 MG T: 20 | 45 days supply | Qty: 90 | Fill #1

## 2020-05-08 MED FILL — busPIRone HCL 15 MG TABS: 15 | 90 days supply | Qty: 180 | Fill #1

## 2020-07-14 ENCOUNTER — Other Ambulatory Visit (HOSPITAL_COMMUNITY): Payer: Self-pay

## 2020-11-16 ENCOUNTER — Other Ambulatory Visit (HOSPITAL_COMMUNITY): Payer: Self-pay

## 2021-03-15 ENCOUNTER — Other Ambulatory Visit (HOSPITAL_BASED_OUTPATIENT_CLINIC_OR_DEPARTMENT_OTHER): Payer: Self-pay

## 2021-03-15 ENCOUNTER — Other Ambulatory Visit (HOSPITAL_COMMUNITY): Payer: Self-pay

## 2021-03-15 MED ORDER — PAXLOVID (300/100) 20 X 150 MG & 10 X 100MG PO TBPK
ORAL_TABLET | ORAL | 0 refills | Status: AC
  Filled 2021-03-15: qty 30, 5d supply, fill #0

## 2021-03-23 ENCOUNTER — Telehealth: Payer: Self-pay | Admitting: Allergy & Immunology

## 2021-03-23 MED ORDER — ALBUTEROL SULFATE HFA 108 (90 BASE) MCG/ACT IN AERS: 2.0000 | INHALATION_SPRAY | Freq: Four times a day (QID) | RESPIRATORY_TRACT | 2 refills | Status: AC | PRN

## 2021-03-23 NOTE — Telephone Encounter (Signed)
Patient recently contracted COVID-19 and did fairly well without Paxlovid.  However, she has developed shortness of breath especially on exertion since having COVID-19.  She has never been asthmatic and does not have albuterol.  She denies fever, chest pain, or other concerning signs.  She is open to trying albuterol.  Confirmed pharmacy and sent it in.

## 2021-03-30 IMAGING — DX DG WRIST COMPLETE 3+V*L*
4 series · 4 of 4 positions shown · non-contrast
Comparison: None.

CLINICAL DATA: Hit hand on door

EXAM:
LEFT WRIST - COMPLETE 3+ VIEW

[wrist pa]
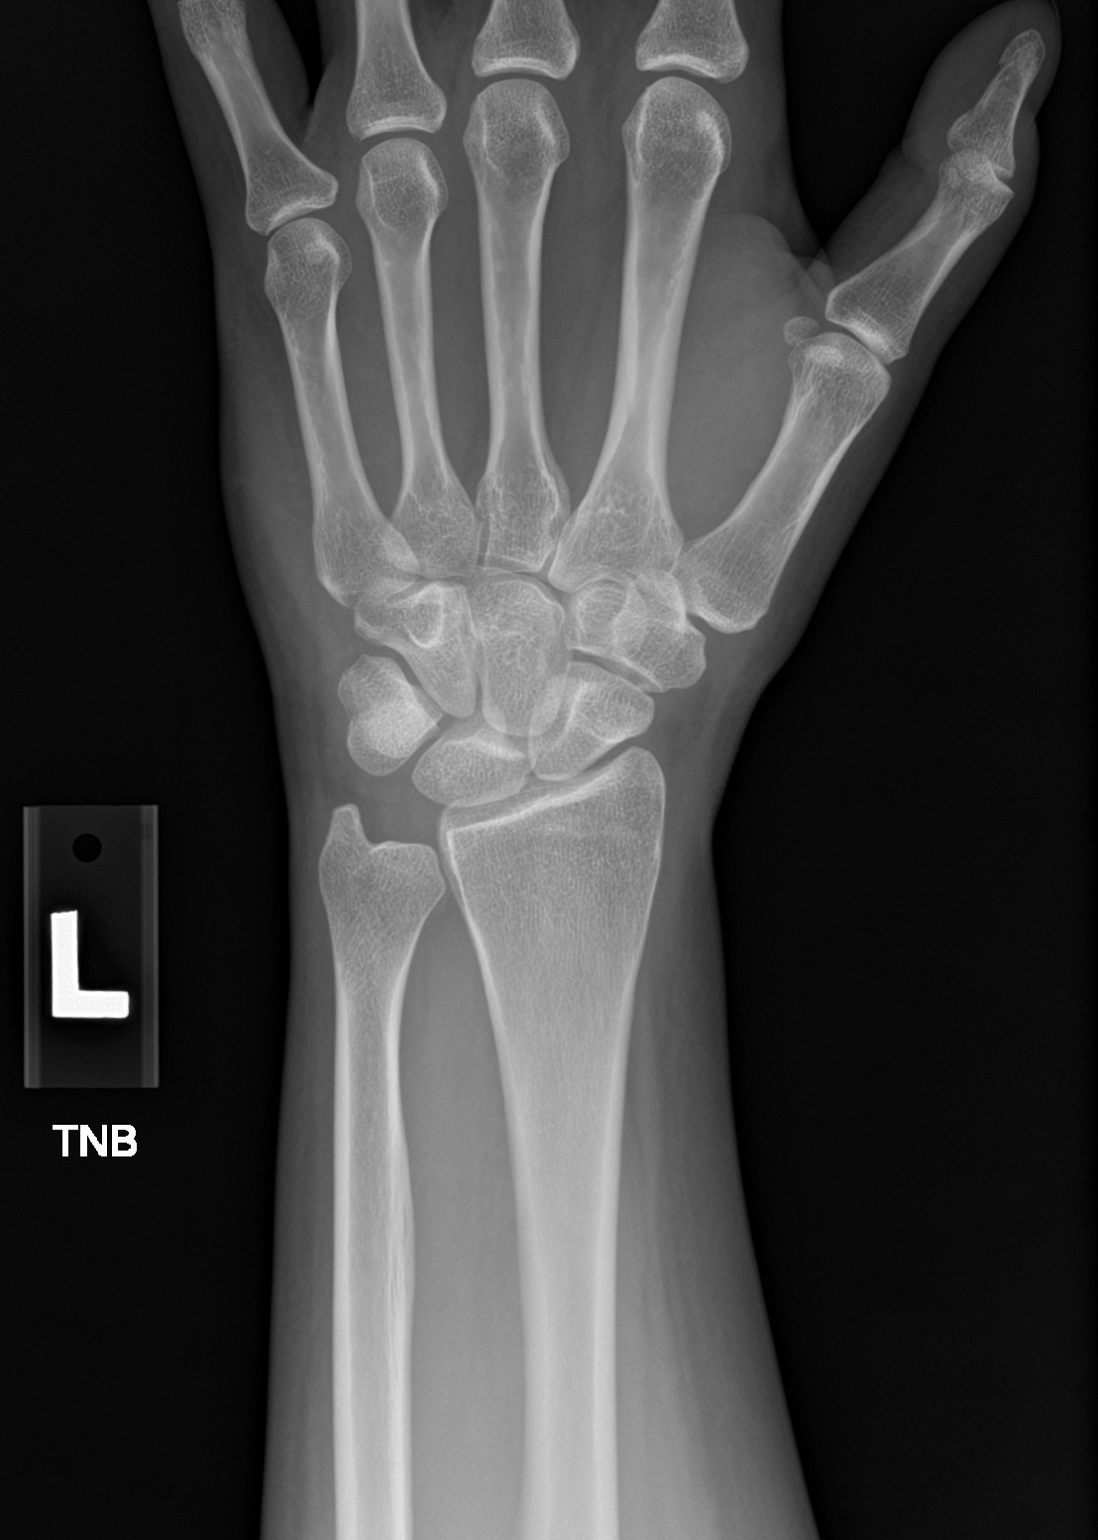

[wrist navicular]
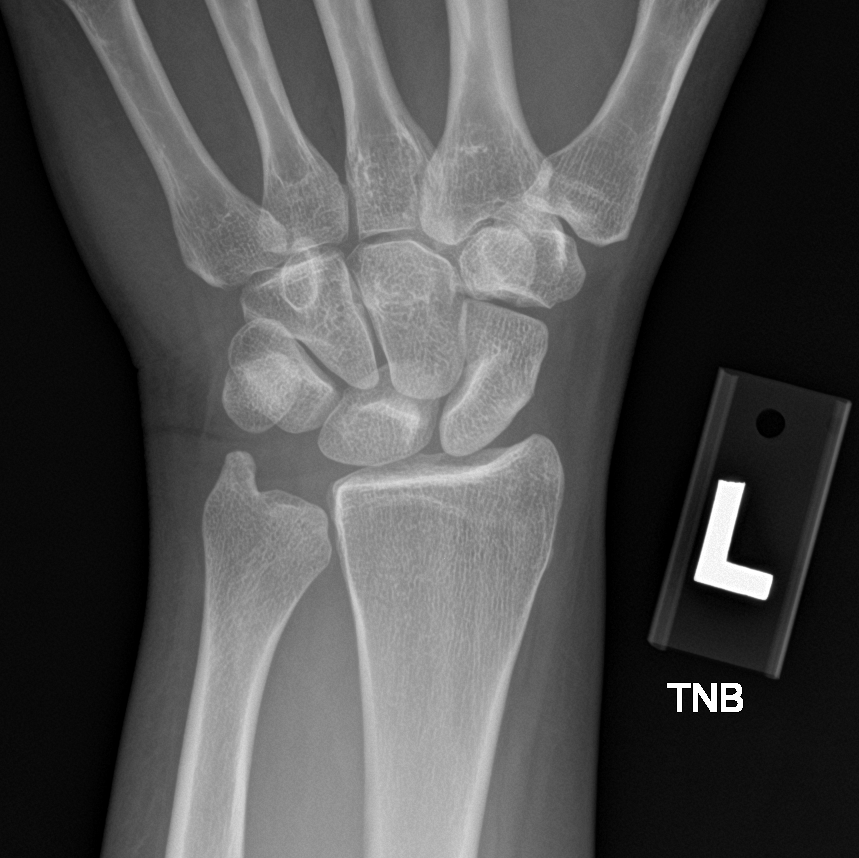

[wrist obl]
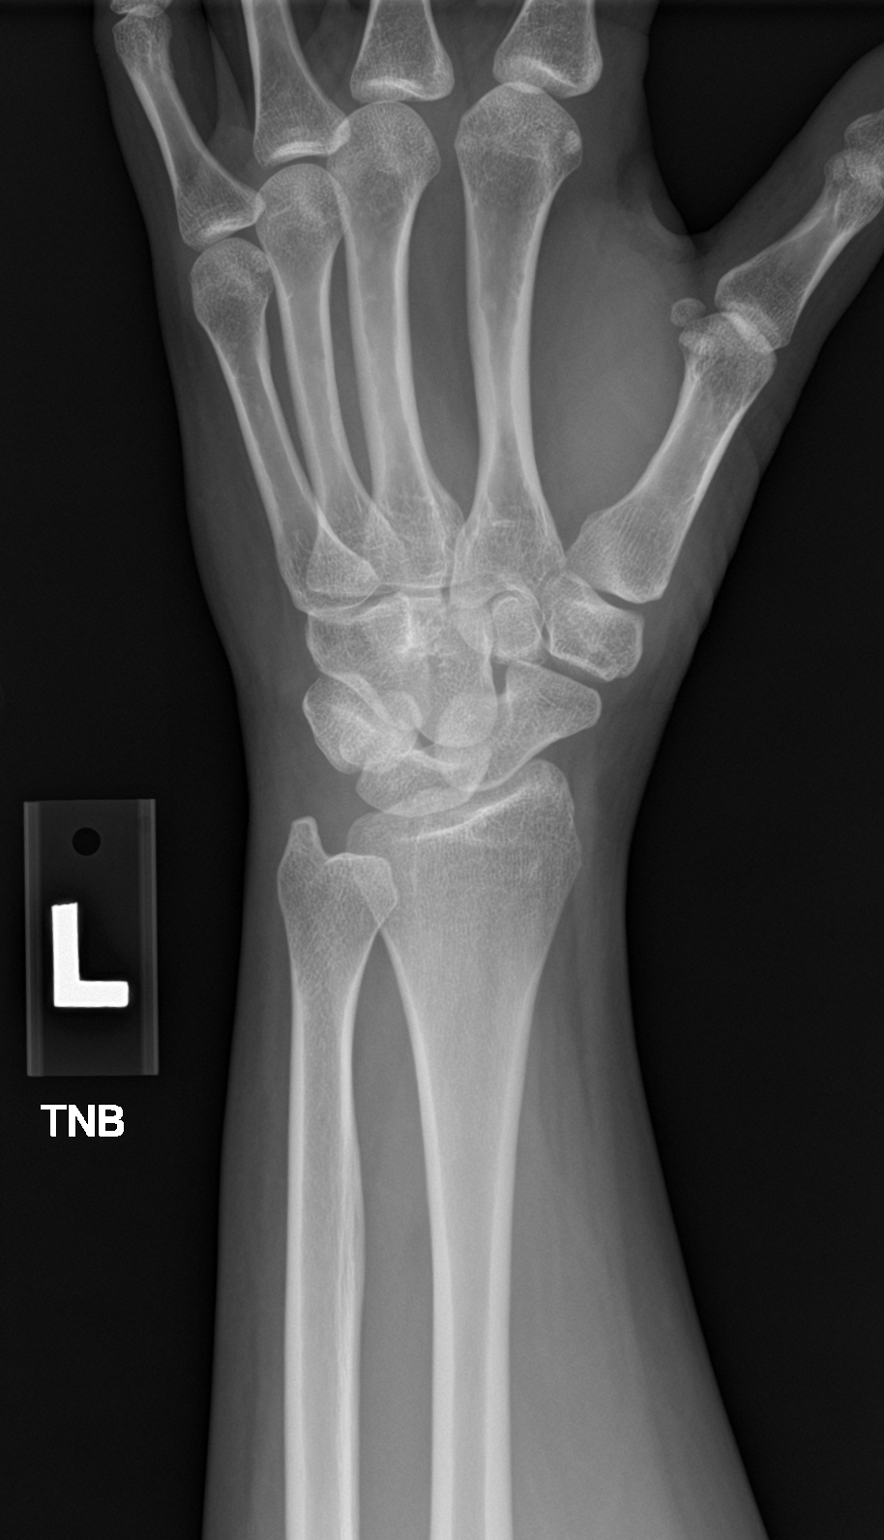

[wrist lat]
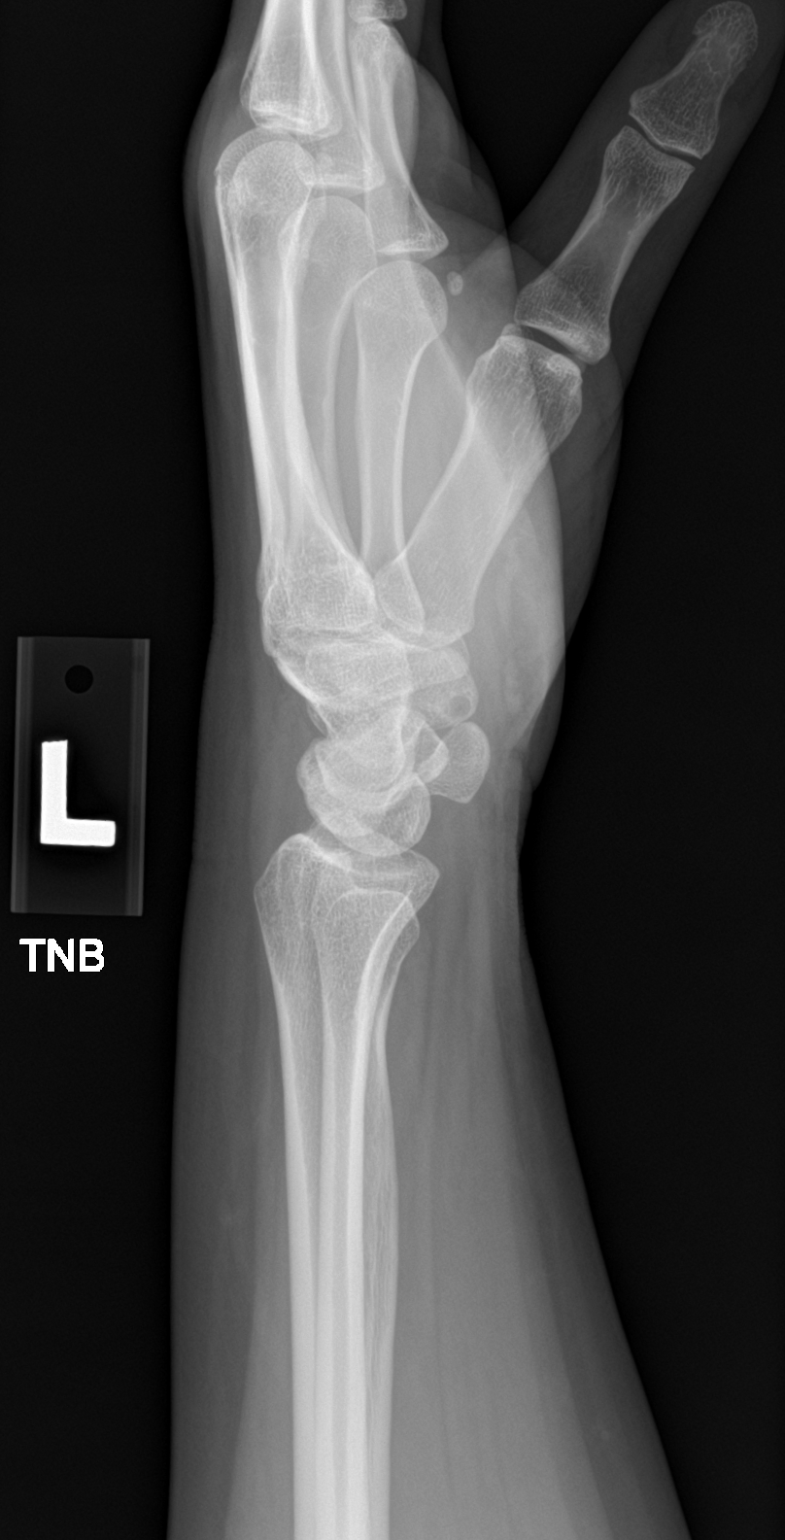

[4 of 4 positions shown; findings below may reference images not displayed]

FINDINGS: There is no evidence of fracture or dislocation. There is no
evidence of arthropathy or other focal bone abnormality. Soft
tissues are unremarkable.
IMPRESSION: Negative.

## 2022-01-10 ENCOUNTER — Ambulatory Visit (INDEPENDENT_AMBULATORY_CARE_PROVIDER_SITE_OTHER): Payer: PRIVATE HEALTH INSURANCE

## 2022-01-10 DIAGNOSIS — Z23 Encounter for immunization: Secondary | ICD-10-CM | POA: Diagnosis not present

## 2022-01-10 NOTE — Progress Notes (Signed)
Patient received her Fluarix Quad injection today in rt deltoid with no problems.

## 2022-07-15 ENCOUNTER — Encounter: Payer: Self-pay | Admitting: *Deleted

## 2023-01-07 ENCOUNTER — Ambulatory Visit (INDEPENDENT_AMBULATORY_CARE_PROVIDER_SITE_OTHER): Payer: BC Managed Care – PPO

## 2023-01-07 DIAGNOSIS — Z23 Encounter for immunization: Secondary | ICD-10-CM

## 2023-01-07 NOTE — Progress Notes (Signed)
Immunotherapy   Patient Details  Name: Brittney Black MRN: [id number] Date of Birth: [birth year]  01/07/2023  Brittney Black received her flu vaccine today (Flulaval Trivalent)  Lot #[id number]  Exp. 09/05/23 Patient tolerated injection well with no adverse reaction after 30 minute wait.   Tishie Altmann J Sherlin Sonier 01/07/2023, 12:02 PM

## 2023-12-31 DIAGNOSIS — M9901 Segmental and somatic dysfunction of cervical region: Secondary | ICD-10-CM | POA: Diagnosis not present

## 2023-12-31 DIAGNOSIS — M47813 Spondylosis without myelopathy or radiculopathy, cervicothoracic region: Secondary | ICD-10-CM | POA: Diagnosis not present

## 2023-12-31 DIAGNOSIS — M9902 Segmental and somatic dysfunction of thoracic region: Secondary | ICD-10-CM | POA: Diagnosis not present

## 2023-12-31 DIAGNOSIS — M62830 Muscle spasm of back: Secondary | ICD-10-CM | POA: Diagnosis not present

## 2023-12-31 DIAGNOSIS — M47817 Spondylosis without myelopathy or radiculopathy, lumbosacral region: Secondary | ICD-10-CM | POA: Diagnosis not present

## 2023-12-31 DIAGNOSIS — M542 Cervicalgia: Secondary | ICD-10-CM | POA: Diagnosis not present

## 2023-12-31 DIAGNOSIS — G44209 Tension-type headache, unspecified, not intractable: Secondary | ICD-10-CM | POA: Diagnosis not present

## 2023-12-31 DIAGNOSIS — M9903 Segmental and somatic dysfunction of lumbar region: Secondary | ICD-10-CM | POA: Diagnosis not present

## 2024-03-06 DIAGNOSIS — Z0000 Encounter for general adult medical examination without abnormal findings: Secondary | ICD-10-CM | POA: Diagnosis not present

## 2024-03-11 ENCOUNTER — Other Ambulatory Visit (HOSPITAL_COMMUNITY): Payer: Self-pay

## 2024-03-11 DIAGNOSIS — N898 Other specified noninflammatory disorders of vagina: Secondary | ICD-10-CM | POA: Diagnosis not present

## 2024-03-11 DIAGNOSIS — E559 Vitamin D deficiency, unspecified: Secondary | ICD-10-CM | POA: Diagnosis not present

## 2024-03-11 MED FILL — Topiramate Tab 50 MG: 50.0000 mg | ORAL | 90 days supply | Qty: 90 | Fill #0 | Status: AC

## 2024-03-11 MED FILL — Pantoprazole Sodium EC Tab 40 MG (Base Equiv): 40.0000 mg | ORAL | 90 days supply | Qty: 90 | Fill #0 | Status: AC

## 2024-03-12 ENCOUNTER — Other Ambulatory Visit (HOSPITAL_COMMUNITY): Payer: Self-pay

## 2024-03-12 MED ORDER — VITAMIN D3 125 MCG (5000 UT) PO TABS: 5000.0000 [IU] | ORAL_TABLET | Freq: Every day | ORAL | 3 refills | Status: AC
